# Patient Record
Sex: Female | Born: 1935 | Race: White | Hispanic: No | Marital: Married | Smoking: Former smoker
Health system: Southern US, Community
[De-identification: ages and names within clinical notes are randomized; demographics above are authoritative.]

## PROBLEM LIST (undated history)

## (undated) DIAGNOSIS — D649 Anemia, unspecified: Secondary | ICD-10-CM

## (undated) DIAGNOSIS — IMO0002 Reserved for concepts with insufficient information to code with codable children: Secondary | ICD-10-CM

## (undated) DIAGNOSIS — M1990 Unspecified osteoarthritis, unspecified site: Secondary | ICD-10-CM

## (undated) DIAGNOSIS — Z8719 Personal history of other diseases of the digestive system: Secondary | ICD-10-CM

## (undated) DIAGNOSIS — Z923 Personal history of irradiation: Secondary | ICD-10-CM

## (undated) DIAGNOSIS — K219 Gastro-esophageal reflux disease without esophagitis: Secondary | ICD-10-CM

## (undated) DIAGNOSIS — C50919 Malignant neoplasm of unspecified site of unspecified female breast: Secondary | ICD-10-CM

## (undated) HISTORY — PX: CARPAL TUNNEL RELEASE: SHX101

## (undated) HISTORY — DX: Gastro-esophageal reflux disease without esophagitis: K21.9

## (undated) HISTORY — DX: Unspecified osteoarthritis, unspecified site: M19.90

## (undated) HISTORY — PX: FINGER SURGERY: SHX640

## (undated) HISTORY — DX: Malignant neoplasm of unspecified site of unspecified female breast: C50.919

## (undated) HISTORY — DX: Anemia, unspecified: D64.9

---

## 2004-07-15 ENCOUNTER — Ambulatory Visit: Payer: Self-pay | Admitting: Gastroenterology

## 2006-08-18 ENCOUNTER — Ambulatory Visit: Payer: Self-pay | Admitting: Gastroenterology

## 2006-08-24 ENCOUNTER — Ambulatory Visit: Payer: Self-pay | Admitting: Gastroenterology

## 2006-12-29 ENCOUNTER — Ambulatory Visit: Payer: Self-pay | Admitting: Internal Medicine

## 2007-11-07 ENCOUNTER — Ambulatory Visit: Payer: Self-pay | Admitting: Gastroenterology

## 2008-07-15 ENCOUNTER — Ambulatory Visit: Payer: Self-pay | Admitting: Internal Medicine

## 2009-07-16 ENCOUNTER — Ambulatory Visit: Payer: Self-pay | Admitting: Internal Medicine

## 2009-09-10 ENCOUNTER — Ambulatory Visit: Payer: Self-pay | Admitting: Gastroenterology

## 2009-10-11 DIAGNOSIS — IMO0001 Reserved for inherently not codable concepts without codable children: Secondary | ICD-10-CM

## 2009-10-11 HISTORY — DX: Reserved for inherently not codable concepts without codable children: IMO0001

## 2010-02-18 ENCOUNTER — Ambulatory Visit: Payer: Self-pay | Admitting: Internal Medicine

## 2010-08-04 ENCOUNTER — Ambulatory Visit: Payer: Self-pay | Admitting: Surgery

## 2010-08-05 ENCOUNTER — Ambulatory Visit: Payer: Self-pay | Admitting: Surgery

## 2010-08-31 ENCOUNTER — Ambulatory Visit: Payer: Self-pay | Admitting: Internal Medicine

## 2010-09-04 ENCOUNTER — Ambulatory Visit: Payer: Self-pay | Admitting: Internal Medicine

## 2010-09-29 ENCOUNTER — Ambulatory Visit: Payer: Self-pay | Admitting: Surgery

## 2010-10-06 ENCOUNTER — Ambulatory Visit: Payer: Self-pay | Admitting: Surgery

## 2010-10-08 LAB — PATHOLOGY REPORT

## 2010-10-11 DIAGNOSIS — C50919 Malignant neoplasm of unspecified site of unspecified female breast: Secondary | ICD-10-CM

## 2010-10-11 HISTORY — PX: BREAST LUMPECTOMY: SHX2

## 2010-10-11 HISTORY — PX: BREAST BIOPSY: SHX20

## 2010-10-11 HISTORY — DX: Malignant neoplasm of unspecified site of unspecified female breast: C50.919

## 2010-10-21 ENCOUNTER — Ambulatory Visit: Payer: Self-pay | Admitting: Surgery

## 2010-10-22 ENCOUNTER — Ambulatory Visit: Payer: Self-pay | Admitting: Oncology

## 2010-10-26 LAB — PATHOLOGY REPORT

## 2010-11-04 ENCOUNTER — Ambulatory Visit: Payer: Self-pay | Admitting: Oncology

## 2010-11-11 ENCOUNTER — Ambulatory Visit: Payer: Self-pay | Admitting: Oncology

## 2010-12-10 ENCOUNTER — Ambulatory Visit: Payer: Self-pay | Admitting: Oncology

## 2011-01-10 ENCOUNTER — Ambulatory Visit: Payer: Self-pay | Admitting: Oncology

## 2011-02-09 ENCOUNTER — Ambulatory Visit: Payer: Self-pay | Admitting: Oncology

## 2011-02-25 LAB — CANCER ANTIGEN 27.29: CA 27.29: 38.7 U/mL — ABNORMAL HIGH (ref 0.0–38.6)

## 2011-03-12 ENCOUNTER — Ambulatory Visit: Payer: Self-pay | Admitting: Oncology

## 2011-04-17 ENCOUNTER — Other Ambulatory Visit: Payer: Self-pay | Admitting: Internal Medicine

## 2011-04-26 ENCOUNTER — Ambulatory Visit: Payer: Self-pay | Admitting: Oncology

## 2011-05-12 ENCOUNTER — Ambulatory Visit: Payer: Self-pay | Admitting: Oncology

## 2011-06-12 ENCOUNTER — Ambulatory Visit: Payer: Self-pay | Admitting: Oncology

## 2011-07-27 ENCOUNTER — Ambulatory Visit: Payer: Self-pay | Admitting: Oncology

## 2011-08-12 ENCOUNTER — Ambulatory Visit: Payer: Self-pay | Admitting: Oncology

## 2011-09-11 ENCOUNTER — Ambulatory Visit: Payer: Self-pay | Admitting: Oncology

## 2011-09-11 LAB — CANCER ANTIGEN 27.29: CA 27.29: 27.4 U/mL (ref 0.0–38.6)

## 2011-10-13 ENCOUNTER — Ambulatory Visit: Payer: Self-pay | Admitting: Oncology

## 2011-12-23 ENCOUNTER — Ambulatory Visit: Payer: Self-pay | Admitting: Gastroenterology

## 2012-03-08 ENCOUNTER — Ambulatory Visit: Payer: Self-pay | Admitting: Oncology

## 2012-03-10 LAB — CBC CANCER CENTER
Eosinophil #: 0.1 x10 3/mm (ref 0.0–0.7)
Eosinophil %: 1.7 %
HGB: 12.6 g/dL (ref 12.0–16.0)
Lymphocyte #: 1.4 x10 3/mm (ref 1.0–3.6)
Lymphocyte %: 29.4 %
MCV: 100 fL (ref 80–100)
Monocyte %: 9.5 %
Neutrophil %: 58.8 %
Platelet: 146 x10 3/mm — ABNORMAL LOW (ref 150–440)
RBC: 3.72 10*6/uL — ABNORMAL LOW (ref 3.80–5.20)
RDW: 13.2 % (ref 11.5–14.5)

## 2012-03-10 LAB — COMPREHENSIVE METABOLIC PANEL
Anion Gap: 8 (ref 7–16)
Bilirubin,Total: 0.7 mg/dL (ref 0.2–1.0)
Chloride: 107 mmol/L (ref 98–107)
Co2: 27 mmol/L (ref 21–32)
Creatinine: 0.89 mg/dL (ref 0.60–1.30)
EGFR (African American): >60
EGFR (Non-African Amer.): >60
Glucose: 111 mg/dL — ABNORMAL HIGH (ref 65–99)
Potassium: 3.9 mmol/L (ref 3.5–5.1)
SGOT(AST): 21 U/L (ref 15–37)
Total Protein: 6.6 g/dL (ref 6.4–8.2)

## 2012-03-11 ENCOUNTER — Ambulatory Visit: Payer: Self-pay | Admitting: Oncology

## 2012-03-11 LAB — CANCER ANTIGEN 27.29: CA 27.29: 32.8 U/mL (ref 0.0–38.6)

## 2012-10-16 ENCOUNTER — Ambulatory Visit: Payer: Self-pay | Admitting: Oncology

## 2012-10-18 ENCOUNTER — Ambulatory Visit: Payer: Self-pay | Admitting: Oncology

## 2012-10-20 ENCOUNTER — Ambulatory Visit: Payer: Self-pay | Admitting: Oncology

## 2012-10-20 LAB — COMPREHENSIVE METABOLIC PANEL
Albumin: 3.6 g/dL (ref 3.4–5.0)
Alkaline Phosphatase: 54 U/L (ref 50–136)
Bilirubin,Total: 0.5 mg/dL (ref 0.2–1.0)
Co2: 29 mmol/L (ref 21–32)
Creatinine: 0.93 mg/dL (ref 0.60–1.30)
Glucose: 157 mg/dL — ABNORMAL HIGH (ref 65–99)
Osmolality: 292 (ref 275–301)
SGPT (ALT): 22 U/L (ref 12–78)
Sodium: 144 mmol/L (ref 136–145)

## 2012-10-20 LAB — CBC CANCER CENTER
Basophil #: 0 x10 3/mm (ref 0.0–0.1)
Basophil %: 0.9 %
Eosinophil #: 0.1 x10 3/mm (ref 0.0–0.7)
Eosinophil %: 1 %
HGB: 12.9 g/dL (ref 12.0–16.0)
Lymphocyte #: 1.3 x10 3/mm (ref 1.0–3.6)
MCH: 32.9 pg (ref 26.0–34.0)
MCV: 98 fL (ref 80–100)
Monocyte #: 0.4 x10 3/mm (ref 0.2–0.9)
Monocyte %: 8.3 %
Neutrophil #: 3.4 x10 3/mm (ref 1.4–6.5)
Neutrophil %: 64.4 %
Platelet: 155 x10 3/mm (ref 150–440)
RDW: 12.8 % (ref 11.5–14.5)
WBC: 5.2 x10 3/mm (ref 3.6–11.0)

## 2012-11-09 ENCOUNTER — Ambulatory Visit: Payer: Self-pay | Admitting: Surgery

## 2012-11-09 DIAGNOSIS — I10 Essential (primary) hypertension: Secondary | ICD-10-CM

## 2012-11-11 ENCOUNTER — Ambulatory Visit: Payer: Self-pay | Admitting: Oncology

## 2012-11-14 IMAGING — US ULTRASOUND RIGHT BREAST
1 series · 17 of 25 positions shown · non-contrast
Comparison: 08/31/2010, 07/16/2009 and 07/15/2008.

REASON FOR EXAM: RT ROUNDED ASYMMETRY
COMMENTS:

PROCEDURE:     US  - US BREAST RIGHT  - September 04, 2010 [DATE]
RESULT:

[Series 1: ultrasound right breast · 17 of 27 slices shown]
[im 1/27]
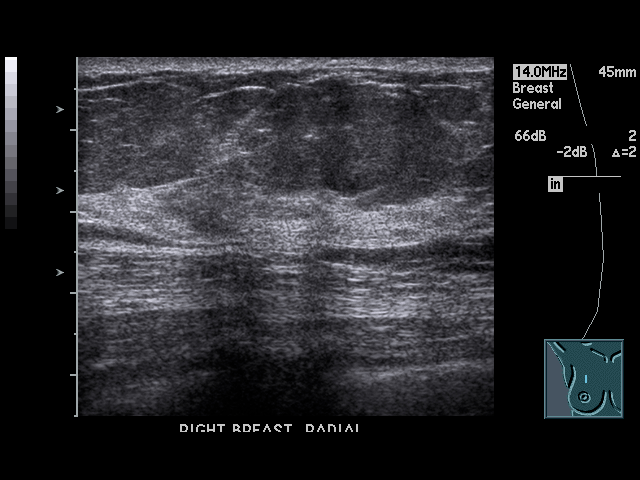
[im 3/27]
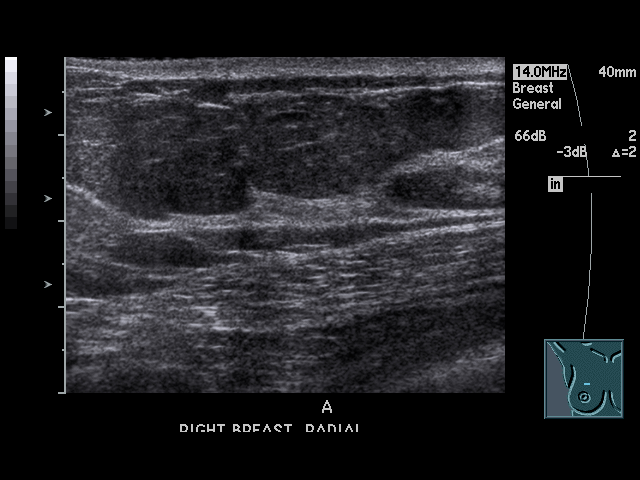
[im 4/27]
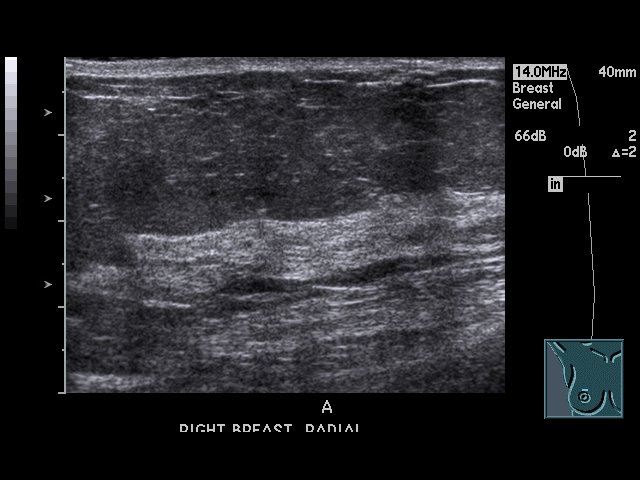
[im 6/27]
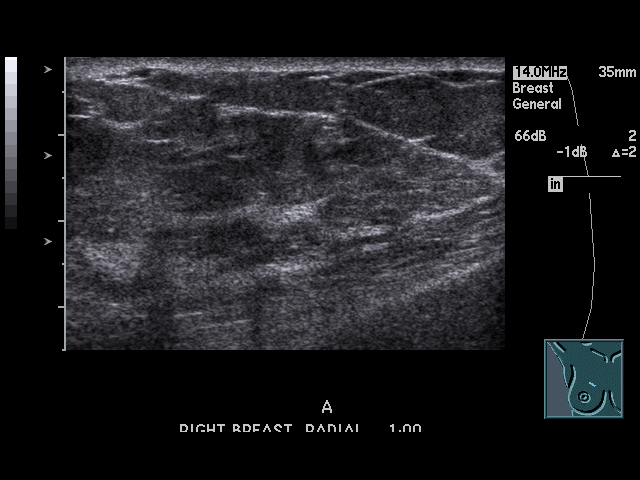
[im 7/27]
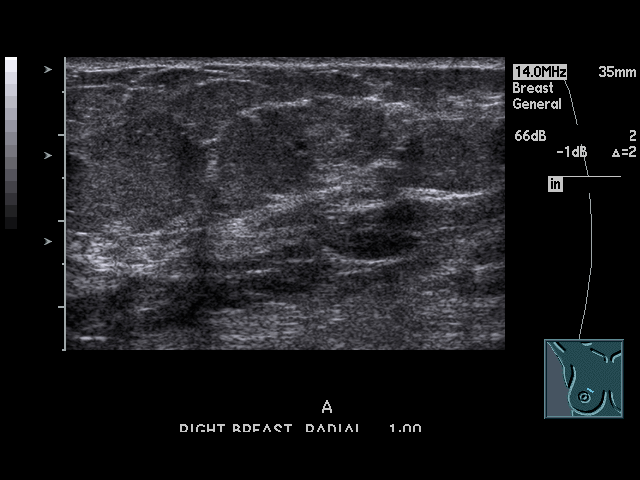
[im 9/27]
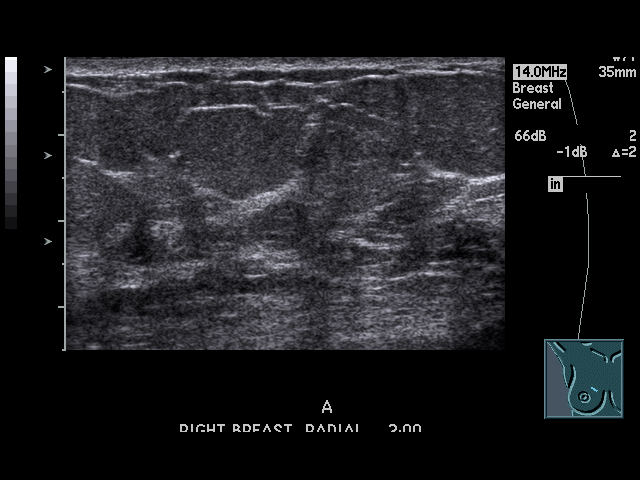
[im 10/27]
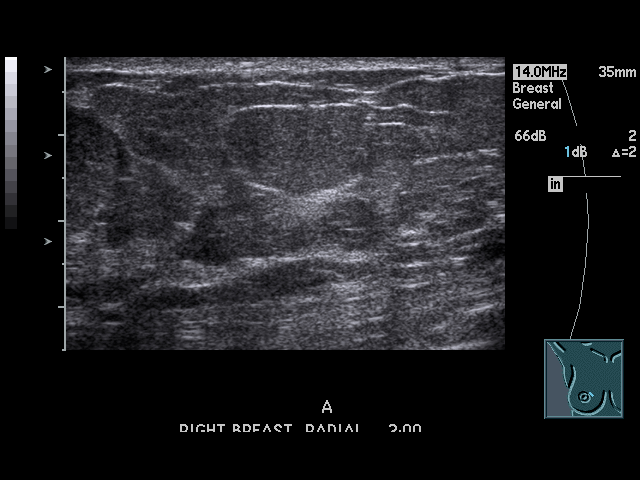
[im 12/27]
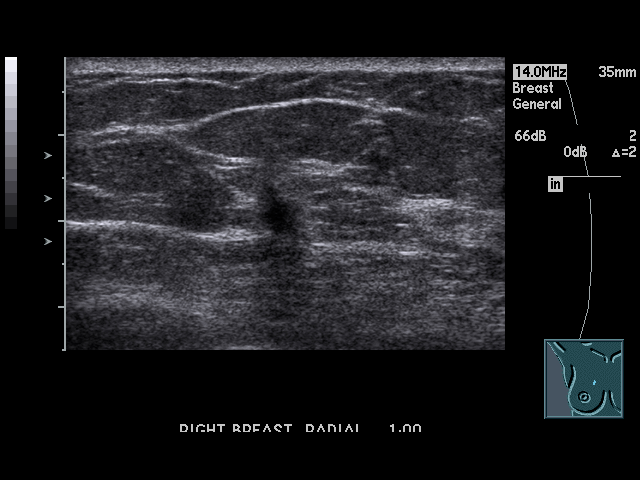
[im 14/27]
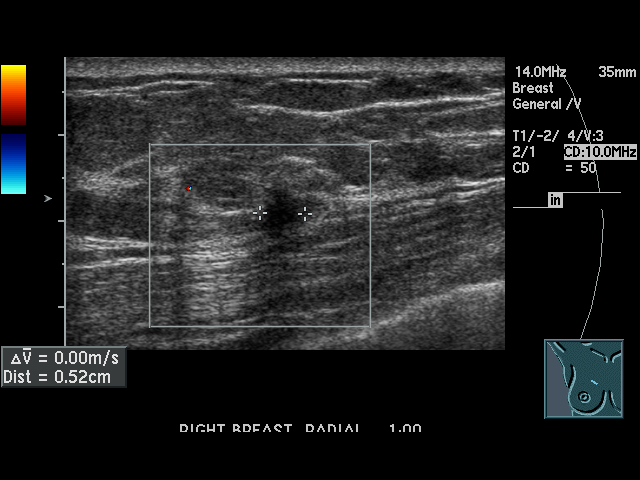
[im 15/27]
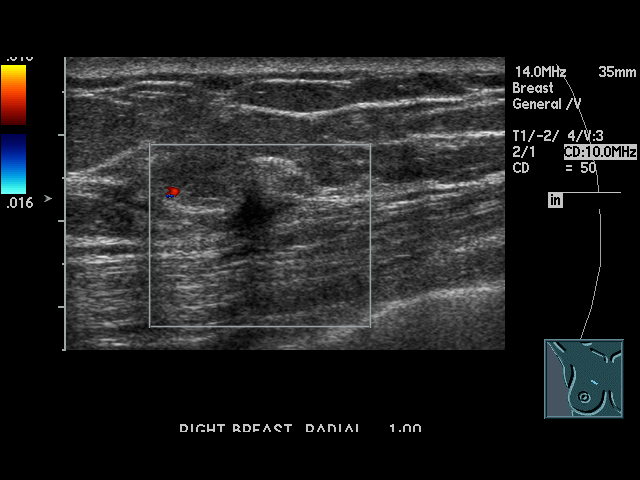
[im 17/27]
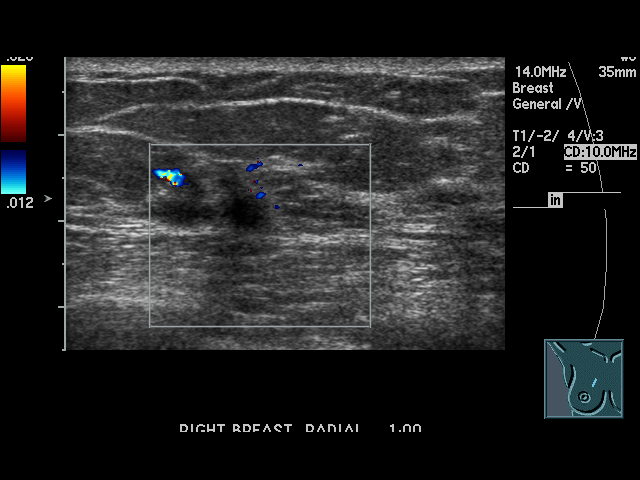
[im 18/27]
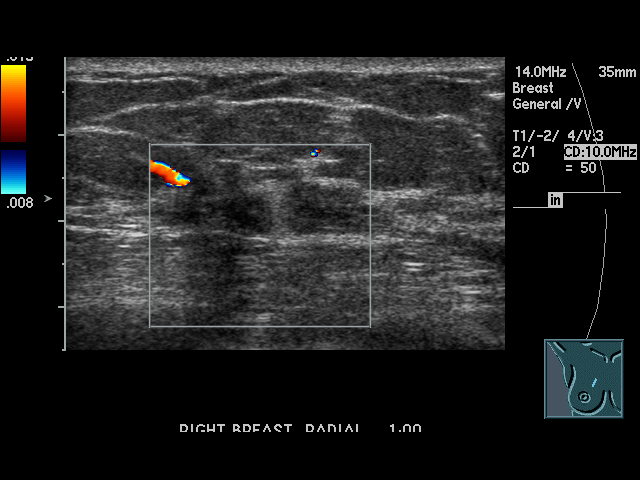
[im 20/27]
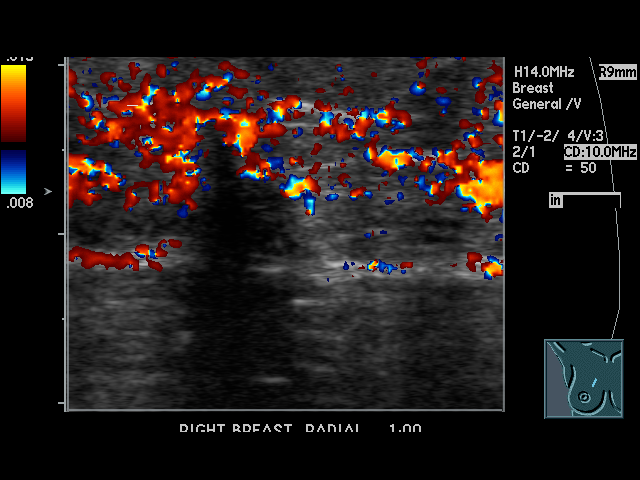
[im 21/27]
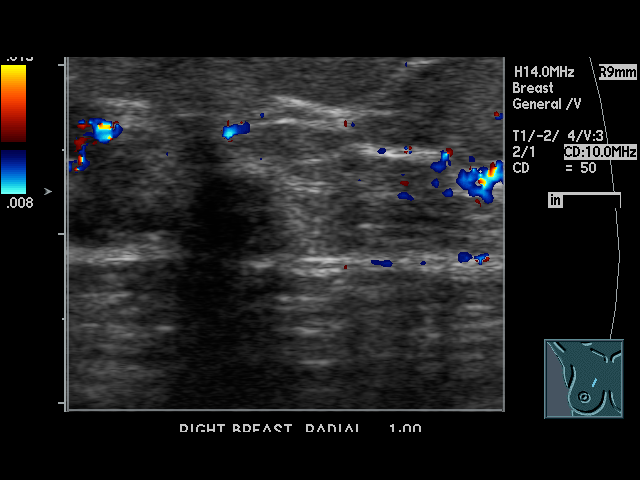
[im 23/27]
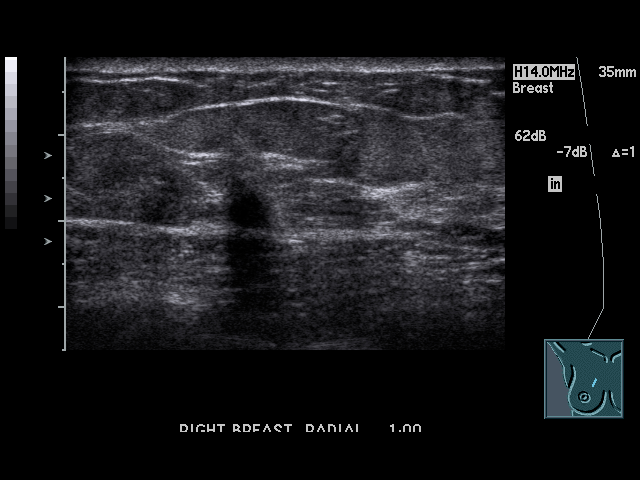
[im 24/27]
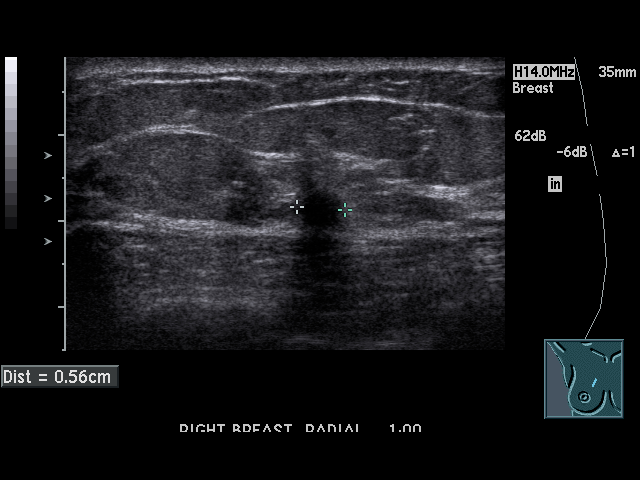
[im 27/27]
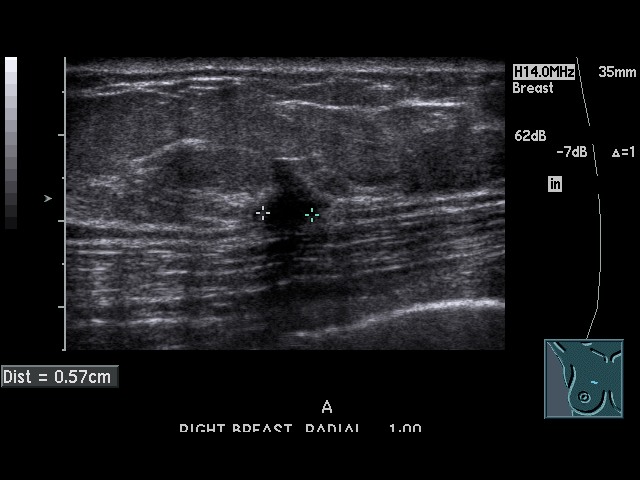

[17 of 25 positions shown; findings below may reference images not displayed]

FINDINGS: True lateral and spot compression magnification views were
performed to evaluate the findings in the right breast on the screening
mammograms. These views demonstrated a small mass in the superior medial
aspect of the right breast at approximately 1 o'clock. The mass demonstrates
spiculated margins. There are associated pleomorphic microcalcifications.

Real-time ultrasound was performed of the right breast. At approximately 1
o'clock there is a small, hypoechoic mass which presents predominantly with
heterogeneous shadowing. It measures approximately 6 mm in diameter. This
mass is just above the pectoralis muscle.
IMPRESSION: BI-RADS: Category 4C: Moderately high suspicion for malignancy.

RECOMMENDATION:  Surgical consultation and tissue diagnosis is suggested for
the mass in the superior medial right beast.

## 2012-12-31 IMAGING — NM NM SENTINAL NODE INJECTION (BREAST) - NO REPORT
2 series · 16 of 16 positions shown · non-contrast
Comparison: none

REASON FOR EXAM: rt breast partial mastectomy SN bx poss axillary
dissection  surgery at 11am...
COMMENTS:

PROCEDURE:     NM  - NM SENTINEL NODE  BREAST  - October 21, 2010  [DATE]
RESULT:     Comparison:  None.
Radiopharmaceutical:  1.07 mCi Nc-YYm unfiltered sulfur colloid in 2 mL
volume, injected subcutaneously into the periareolar tissues of the right
breast.
TECHNIQUE: Using sterile technique, local anesthesia was provided with
approximately 2 mL of 1% lidocaine. Using a 27 gauge, 1/2 inch needle, the
radiopharmaceutical was injected into the subcutaneous periareolar tissues
of the left breast. Planar images were obtained in the anterior and left
lateral projections, with the use of a Uo-H1 transmission source. The
patient's arm was abducted at 90 degrees from the body for the anterior
images, and raised over the head for the lateral images.

[Series 1000: sent node breast static · 2.40mm/px · 5 acquisitions, 10 frames shown]
[im 1/5]
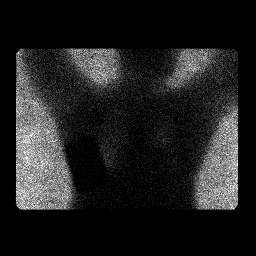
[im 1/5]
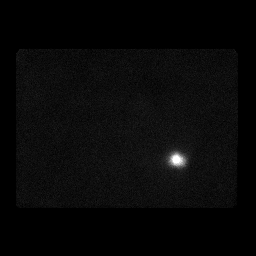
[im 2/5]
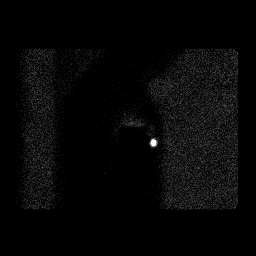
[im 2/5]
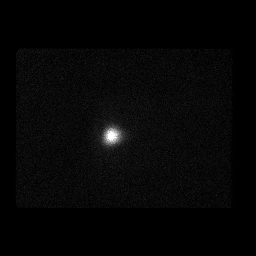
[im 3/5]
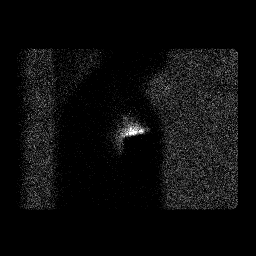
[im 3/5]
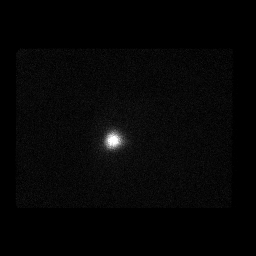
[im 4/5]
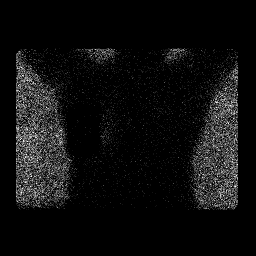
[im 4/5]
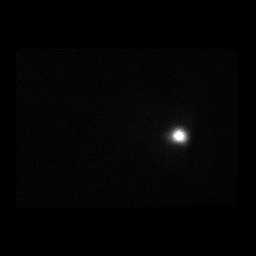
[im 5/5]
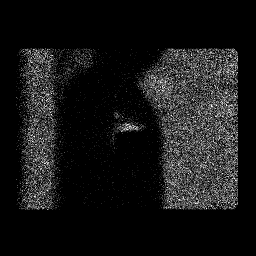
[im 5/5]
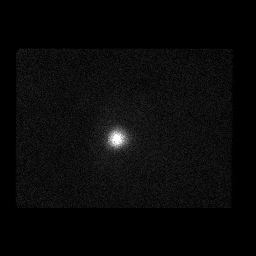

[Series 1000: sent node breast-dynamic · 4.80mm/px · 6 of 59 frames shown]
[frame 5/59  full-range]
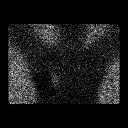
[frame 15/59  full-range]
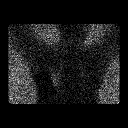
[frame 25/59  full-range]
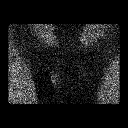
[frame 35/59  full-range]
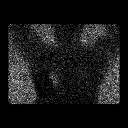
[frame 45/59  full-range]
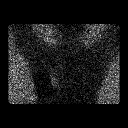
[frame 55/59  full-range]
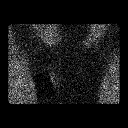

[16 of 16 positions shown; findings below may reference images not displayed]

FINDINGS: Images obtained at 90 minutes post injection demonstrate
localization of radiotracer in the region of the right lateral breast.
IMPRESSION: Successful injection of radiotracer into the right breast
periareolar soft tissues for intraoperative localization.

## 2013-03-08 ENCOUNTER — Ambulatory Visit: Payer: Self-pay | Admitting: Radiation Oncology

## 2013-03-11 ENCOUNTER — Ambulatory Visit: Payer: Self-pay | Admitting: Radiation Oncology

## 2013-05-14 ENCOUNTER — Ambulatory Visit: Payer: Self-pay | Admitting: Surgery

## 2013-10-18 ENCOUNTER — Ambulatory Visit: Payer: Self-pay | Admitting: Gastroenterology

## 2013-12-10 ENCOUNTER — Ambulatory Visit: Payer: Self-pay | Admitting: Gastroenterology

## 2014-01-15 ENCOUNTER — Ambulatory Visit: Payer: Self-pay | Admitting: Oncology

## 2014-01-21 ENCOUNTER — Ambulatory Visit: Payer: Self-pay | Admitting: Oncology

## 2014-01-22 LAB — COMPREHENSIVE METABOLIC PANEL
ALBUMIN: 3.7 g/dL (ref 3.4–5.0)
ANION GAP: 8 (ref 7–16)
Alkaline Phosphatase: 52 U/L
BILIRUBIN TOTAL: 0.5 mg/dL (ref 0.2–1.0)
BUN: 16 mg/dL (ref 7–18)
CHLORIDE: 105 mmol/L (ref 98–107)
CREATININE: 0.88 mg/dL (ref 0.60–1.30)
Calcium, Total: 9.6 mg/dL (ref 8.5–10.1)
Co2: 31 mmol/L (ref 21–32)
EGFR (African American): >60
EGFR (Non-African Amer.): >60
GLUCOSE: 87 mg/dL (ref 65–99)
Osmolality: 287 (ref 275–301)
Potassium: 4.3 mmol/L (ref 3.5–5.1)
SGOT(AST): 13 U/L — ABNORMAL LOW (ref 15–37)
SGPT (ALT): 16 U/L (ref 12–78)
Sodium: 144 mmol/L (ref 136–145)
TOTAL PROTEIN: 7 g/dL (ref 6.4–8.2)

## 2014-01-22 LAB — CBC CANCER CENTER
Basophil #: 0 x10 3/mm (ref 0.0–0.1)
Basophil %: 0.8 %
EOS PCT: 1.6 %
Eosinophil #: 0.1 x10 3/mm (ref 0.0–0.7)
HCT: 39.4 % (ref 35.0–47.0)
HGB: 12.9 g/dL (ref 12.0–16.0)
LYMPHS ABS: 1.7 x10 3/mm (ref 1.0–3.6)
LYMPHS PCT: 30.6 %
MCH: 32.1 pg (ref 26.0–34.0)
MCHC: 32.8 g/dL (ref 32.0–36.0)
MCV: 98 fL (ref 80–100)
MONO ABS: 0.7 x10 3/mm (ref 0.2–0.9)
Monocyte %: 11.8 %
NEUTROS PCT: 55.2 %
Neutrophil #: 3.2 x10 3/mm (ref 1.4–6.5)
Platelet: 173 x10 3/mm (ref 150–440)
RBC: 4.03 10*6/uL (ref 3.80–5.20)
RDW: 13.3 % (ref 11.5–14.5)
WBC: 5.7 x10 3/mm (ref 3.6–11.0)

## 2014-02-08 ENCOUNTER — Ambulatory Visit: Payer: Self-pay | Admitting: Oncology

## 2014-02-22 ENCOUNTER — Ambulatory Visit: Payer: Self-pay | Admitting: Gastroenterology

## 2014-05-17 DIAGNOSIS — K219 Gastro-esophageal reflux disease without esophagitis: Secondary | ICD-10-CM | POA: Insufficient documentation

## 2014-05-17 DIAGNOSIS — E785 Hyperlipidemia, unspecified: Secondary | ICD-10-CM | POA: Insufficient documentation

## 2014-05-17 DIAGNOSIS — R739 Hyperglycemia, unspecified: Secondary | ICD-10-CM | POA: Insufficient documentation

## 2014-05-17 DIAGNOSIS — I10 Essential (primary) hypertension: Secondary | ICD-10-CM | POA: Insufficient documentation

## 2014-06-04 DIAGNOSIS — D510 Vitamin B12 deficiency anemia due to intrinsic factor deficiency: Secondary | ICD-10-CM | POA: Insufficient documentation

## 2014-07-04 ENCOUNTER — Ambulatory Visit: Payer: Self-pay | Admitting: Oncology

## 2014-07-04 LAB — COMPREHENSIVE METABOLIC PANEL
ALK PHOS: 54 U/L
ANION GAP: 7 (ref 7–16)
AST: 17 U/L (ref 15–37)
Albumin: 3.8 g/dL (ref 3.4–5.0)
BILIRUBIN TOTAL: 0.6 mg/dL (ref 0.2–1.0)
BUN: 17 mg/dL (ref 7–18)
CREATININE: 0.98 mg/dL (ref 0.60–1.30)
Calcium, Total: 9.2 mg/dL (ref 8.5–10.1)
Chloride: 100 mmol/L (ref 98–107)
Co2: 31 mmol/L (ref 21–32)
EGFR (African American): >60
EGFR (Non-African Amer.): 59 — ABNORMAL LOW
Glucose: 210 mg/dL — ABNORMAL HIGH (ref 65–99)
Osmolality: 283 (ref 275–301)
Potassium: 3.5 mmol/L (ref 3.5–5.1)
SGPT (ALT): 22 U/L
Sodium: 138 mmol/L (ref 136–145)
Total Protein: 6.6 g/dL (ref 6.4–8.2)

## 2014-07-04 LAB — CBC CANCER CENTER
Basophil #: 0.1 x10 3/mm (ref 0.0–0.1)
Basophil %: 0.9 %
EOS ABS: 0.1 x10 3/mm (ref 0.0–0.7)
EOS PCT: 1.8 %
HCT: 40.2 % (ref 35.0–47.0)
HGB: 13.4 g/dL (ref 12.0–16.0)
Lymphocyte #: 1.7 x10 3/mm (ref 1.0–3.6)
Lymphocyte %: 27.9 %
MCH: 33 pg (ref 26.0–34.0)
MCHC: 33.3 g/dL (ref 32.0–36.0)
MCV: 99 fL (ref 80–100)
MONO ABS: 0.4 x10 3/mm (ref 0.2–0.9)
MONOS PCT: 7.1 %
NEUTROS ABS: 3.8 x10 3/mm (ref 1.4–6.5)
NEUTROS PCT: 62.3 %
Platelet: 168 x10 3/mm (ref 150–440)
RBC: 4.05 10*6/uL (ref 3.80–5.20)
RDW: 13.5 % (ref 11.5–14.5)
WBC: 6.1 x10 3/mm (ref 3.6–11.0)

## 2014-07-11 ENCOUNTER — Ambulatory Visit: Payer: Self-pay | Admitting: Oncology

## 2015-01-20 ENCOUNTER — Ambulatory Visit: Admit: 2015-01-20 | Disposition: A | Discharge status: Home / Self Care | Payer: Self-pay | Admitting: Oncology

## 2015-01-22 ENCOUNTER — Ambulatory Visit
Admit: 2015-01-22 | Disposition: A | Discharge status: Home / Self Care | Payer: Self-pay | Attending: Oncology | Admitting: Oncology

## 2015-01-22 LAB — CBC CANCER CENTER
BASOS ABS: 0 x10 3/mm (ref 0.0–0.1)
BASOS PCT: 0.8 %
EOS ABS: 0.1 x10 3/mm (ref 0.0–0.7)
Eosinophil %: 1.3 %
HCT: 39.3 % (ref 35.0–47.0)
HGB: 13.4 g/dL (ref 12.0–16.0)
LYMPHS ABS: 1.8 x10 3/mm (ref 1.0–3.6)
Lymphocyte %: 29.9 %
MCH: 33 pg (ref 26.0–34.0)
MCHC: 34.2 g/dL (ref 32.0–36.0)
MCV: 97 fL (ref 80–100)
MONO ABS: 0.5 x10 3/mm (ref 0.2–0.9)
Monocyte %: 9.3 %
NEUTROS ABS: 3.5 x10 3/mm (ref 1.4–6.5)
Neutrophil %: 58.7 %
Platelet: 166 x10 3/mm (ref 150–440)
RBC: 4.07 10*6/uL (ref 3.80–5.20)
RDW: 13.3 % (ref 11.5–14.5)
WBC: 5.9 x10 3/mm (ref 3.6–11.0)

## 2015-01-22 LAB — COMPREHENSIVE METABOLIC PANEL
Albumin: 4.2 g/dL
Alkaline Phosphatase: 42 U/L
Anion Gap: 6 — ABNORMAL LOW (ref 7–16)
BUN: 15 mg/dL
Bilirubin,Total: 0.7 mg/dL
CREATININE: 0.81 mg/dL
Calcium, Total: 9.4 mg/dL
Chloride: 104 mmol/L
Co2: 28 mmol/L
EGFR (African American): >60
EGFR (Non-African Amer.): >60
Glucose: 115 mg/dL — ABNORMAL HIGH
POTASSIUM: 4.1 mmol/L
SGOT(AST): 20 U/L
SGPT (ALT): 15 U/L
SODIUM: 138 mmol/L
Total Protein: 7 g/dL

## 2015-03-06 ENCOUNTER — Encounter (INDEPENDENT_AMBULATORY_CARE_PROVIDER_SITE_OTHER): Payer: Self-pay

## 2015-03-06 ENCOUNTER — Other Ambulatory Visit: Payer: Self-pay | Admitting: *Deleted

## 2015-03-06 ENCOUNTER — Ambulatory Visit
Admission: RE | Admit: 2015-03-06 | Discharge: 2015-03-06 | Disposition: A | Discharge status: Home / Self Care | Payer: Medicare Other | Source: Ambulatory Visit | Attending: Radiation Oncology | Admitting: Radiation Oncology

## 2015-03-06 ENCOUNTER — Encounter: Payer: Self-pay | Admitting: Radiation Oncology

## 2015-03-06 VITALS — BP 152/85 | HR 66 | Temp 97.8°F | Resp 18 | Ht 62.0 in | Wt 160.6 lb

## 2015-03-06 DIAGNOSIS — C50411 Malignant neoplasm of upper-outer quadrant of right female breast: Secondary | ICD-10-CM

## 2015-03-06 NOTE — Progress Notes (Signed)
Radiation Oncology Follow up Note  Name: Cheryl Diaz   Date:   03/06/2015 MRN:  [id number] DOB: 25-Oct-1935    This 79 y.o. female presents to the clinic today for follow-up of breast cancer.  REFERRING PROVIDER: No ref. provider found  HPI: Patient is a 79 year old female now out 4 years having completed radiation therapy to her right breast for a stage IC ER/PR positive HER-2/neu negative invasive mammary carcinoma with Oncotype DX score of 15. She been on multiple aromatase inhibitors including rheumatoid asked as well as tamoxifen which have both been discontinued secondary to patient tolerance. Mostly exacerbation of her arthritis. She specifically denies breast tenderness cough or bone pain. His a small less than 1 cm soft node present in her right axilla which is dry my attention to she states has been unchanged over the past year..  COMPLICATIONS OF TREATMENT: none  FOLLOW UP COMPLIANCE: keeps appointments   PHYSICAL EXAM:  BP 152/85 mmHg  Pulse 66  Temp(Src) 97.8 F (36.6 C)  Resp 18  Ht $R'5\' 2"'hD$  (1.575 m)  Wt 160 lb 9.7 oz (72.85 kg)  BMI 29.37 kg/m2 Well-developed elderly female in NAD. Right breast has inverted nipple. There is a small less than 1 cm soft well circumscribed apparent lymph node in the right axilla somewhat tender to the touch. Left breast is free of dominant mass or nodularity in 2 positions examined. No supraclavicular adenopathy is identified. Well-developed well-nourished patient in NAD. HEENT reveals PERLA, EOMI, discs not visualized.  Oral cavity is clear. No oral mucosal lesions are identified. Neck is clear without evidence of cervical or supraclavicular adenopathy. Lungs are clear to A&P. Cardiac examination is essentially unremarkable with regular rate and rhythm without murmur rub or thrill. Abdomen is benign with no organomegaly or masses noted. Motor sensory and DTR levels are equal and symmetric in the upper and lower extremities. Cranial nerves  II through XII are grossly intact. Proprioception is intact. No peripheral adenopathy or edema is identified. No motor or sensory levels are noted. Crude visual fields are within normal range.   RADIOLOGY RESULTS: Patient is scheduled for mammograms next week.  PLAN: At the present time she is now 4 years out and doing well. I've asked her to draw the attention to this small node to medical oncology and she is to contact my office should she notice any enlargement or worsening symptoms. At this time patient wishes to discontinue follow-up care I am turning follow-up care over to medical oncology. We'll be happy to reevaluate patient at any time in the future should radiation oncology opinion be indicated.  I would like to take this opportunity for allowing me to participate in the care of your patient.Armstead Peaks., MD

## 2015-03-25 ENCOUNTER — Other Ambulatory Visit: Payer: Self-pay | Admitting: Oncology

## 2015-07-17 ENCOUNTER — Other Ambulatory Visit: Payer: Self-pay | Admitting: *Deleted

## 2015-07-17 DIAGNOSIS — C50919 Malignant neoplasm of unspecified site of unspecified female breast: Secondary | ICD-10-CM

## 2015-07-24 ENCOUNTER — Inpatient Hospital Stay (HOSPITAL_BASED_OUTPATIENT_CLINIC_OR_DEPARTMENT_OTHER): Payer: Medicare Other | Admitting: Oncology

## 2015-07-24 ENCOUNTER — Inpatient Hospital Stay: Payer: Medicare Other | Attending: Oncology

## 2015-07-24 VITALS — BP 136/73 | HR 73 | Temp 95.8°F | Wt 151.5 lb

## 2015-07-24 DIAGNOSIS — Z170 Estrogen receptor positive status [ER+]: Secondary | ICD-10-CM | POA: Diagnosis not present

## 2015-07-24 DIAGNOSIS — Z87891 Personal history of nicotine dependence: Secondary | ICD-10-CM | POA: Diagnosis not present

## 2015-07-24 DIAGNOSIS — Z79810 Long term (current) use of selective estrogen receptor modulators (SERMs): Secondary | ICD-10-CM | POA: Diagnosis not present

## 2015-07-24 DIAGNOSIS — C50911 Malignant neoplasm of unspecified site of right female breast: Secondary | ICD-10-CM

## 2015-07-24 DIAGNOSIS — Z923 Personal history of irradiation: Secondary | ICD-10-CM | POA: Insufficient documentation

## 2015-07-24 DIAGNOSIS — Z79899 Other long term (current) drug therapy: Secondary | ICD-10-CM | POA: Insufficient documentation

## 2015-07-24 DIAGNOSIS — K219 Gastro-esophageal reflux disease without esophagitis: Secondary | ICD-10-CM | POA: Diagnosis not present

## 2015-07-24 DIAGNOSIS — C50919 Malignant neoplasm of unspecified site of unspecified female breast: Secondary | ICD-10-CM

## 2015-07-24 LAB — CBC WITH DIFFERENTIAL/PLATELET
BASOS ABS: 0 10*3/uL (ref 0–0.1)
Basophils Relative: 1 %
EOS ABS: 0.1 10*3/uL (ref 0–0.7)
Eosinophils Relative: 2 %
HCT: 38.8 % (ref 35.0–47.0)
Hemoglobin: 13.1 g/dL (ref 12.0–16.0)
Lymphocytes Relative: 23 %
Lymphs Abs: 1.3 10*3/uL (ref 1.0–3.6)
MCH: 32.9 pg (ref 26.0–34.0)
MCHC: 33.8 g/dL (ref 32.0–36.0)
MCV: 97.3 fL (ref 80.0–100.0)
MONO ABS: 0.4 10*3/uL (ref 0.2–0.9)
Monocytes Relative: 8 %
Neutro Abs: 3.8 10*3/uL (ref 1.4–6.5)
Neutrophils Relative %: 68 %
PLATELETS: 154 10*3/uL (ref 150–440)
RBC: 3.99 MIL/uL (ref 3.80–5.20)
RDW: 13.1 % (ref 11.5–14.5)
WBC: 5.6 10*3/uL (ref 3.6–11.0)

## 2015-07-24 LAB — COMPREHENSIVE METABOLIC PANEL
ALBUMIN: 3.8 g/dL (ref 3.5–5.0)
ALT: 14 U/L (ref 14–54)
AST: 23 U/L (ref 15–41)
Alkaline Phosphatase: 44 U/L (ref 38–126)
Anion gap: 5 (ref 5–15)
BUN: 20 mg/dL (ref 6–20)
CHLORIDE: 106 mmol/L (ref 101–111)
CO2: 29 mmol/L (ref 22–32)
Calcium: 8.6 mg/dL — ABNORMAL LOW (ref 8.9–10.3)
Creatinine, Ser: 0.86 mg/dL (ref 0.44–1.00)
GFR calc Af Amer: >60 mL/min (ref >60)
Glucose, Bld: 168 mg/dL — ABNORMAL HIGH (ref 65–99)
POTASSIUM: 3.9 mmol/L (ref 3.5–5.1)
Sodium: 140 mmol/L (ref 135–145)
TOTAL PROTEIN: 6.4 g/dL — AB (ref 6.5–8.1)
Total Bilirubin: 1 mg/dL (ref 0.3–1.2)

## 2015-07-25 LAB — CANCER ANTIGEN 27.29: CA 27.29: 30.8 U/mL (ref 0.0–38.6)

## 2015-07-26 ENCOUNTER — Encounter: Payer: Self-pay | Admitting: Oncology

## 2015-07-26 NOTE — Progress Notes (Signed)
[place] @ [facility] [phone]  Fax:([phone]) [place] OB: 1936/07/21  MR#: [id number]  OBS#:[id number]  Patient Care Team: Kirk Ruths, MD as PCP - General (Internal Medicine)  CHIEF COMPLAINT:  Chief Complaint/Diagnosis:   Carcinoma of breast (right), stage IC, ER positive, PR positive, HER-2 negative by FISH (in January.:  2012)  Oncotype DX score is 15  patient had to stage IC disease.  Also, was seen by radiation oncologist.  Patient with family for further evaluation and treatment consideration 2.  Arimidex 1 mg by mouth daily  starting from May of 2012 3.  Finished radiation therapy in May of 2012 4.on tamoxifen because of poor tolerance to Arimidex from August of 2012  INTERVAL HISTORY:  79 year old lady came today further follow-up regarding carcinoma of breast.  Patient is taking tamoxifen and tolerating very well.  The patient will finish her 5 years of tamoxifen therapy in January of 2017.  Here for further follow-up and treatment consideration  REVIEW OF SYSTEMS:   GENERAL:  Feels good.  Active.  No fevers, sweats or weight loss. PERFORMANCE STATUS (ECOG):  01 HEENT:  No visual changes, runny nose, sore throat, mouth sores or tenderness. Lungs: No shortness of breath or cough.  No hemoptysis. Cardiac:  No chest pain, palpitations, orthopnea, or PND. GI:  No nausea, vomiting, diarrhea, constipation, melena or hematochezia. GU:  No urgency, frequency, dysuria, or hematuria. Musculoskeletal:  No back pain.  No joint pain.  No muscle tenderness. Extremities:  No pain or swelling. Skin:  No rashes or skin changes. Neuro:  No headache, numbness or weakness, balance or coordination issues. Endocrine:  No diabetes, thyroid issues, hot flashes or night sweats. Psych:  No mood changes, depression or anxiety. Pain:  No focal pain. Review of systems:  All other systems reviewed and found to be negative. As per HPI. Otherwise, a complete  review of systems is negatve.  PAST MEDICAL HISTORY: Past Medical History  Diagnosis Date  . Breast cancer ([place]) 10/2010    ER/PR+, Her2neu-  . Arthritis   . GERD (gastroesophageal reflux disease)   . Anemia     PAST SURGICAL HISTORY: Past Surgical History  Procedure Laterality Date  . Carpal tunnel release      Significant History/PMH:   Breast Cancer:    degenerative disc disease:    GERD:    Arthritis:    Anemia:    Carpal Tunnel Release:   Preventive Screening:  Has patient had any of the following test? Colonscopy  Mammography   Last Colonoscopy: 2009   Last Mammography: April 2015   Smoking History: Smoking History quit 48 years ago.  PFSH: Family History: No family history of colorectal cancer, breast cancer, or ovarian cancer.   .  Family history of hypertension  Social History: negative alcohol, negative tobacco  Comments: Had smoked in the past for 10 years.  Quit   smoking many years ago  Additional Past Medical and Surgical History: Gastroesophageal reflux   Esophageal dietitian  by endoscopy   Had colonoscopy done in 2009.  Had polyps make atypical hyperplasia   Had basal cell carcinoma of the left   side of , nose.   ADVANCED DIRECTIVES:  No flowsheet data found.  HEALTH MAINTENANCE: Social History  Substance Use Topics  . Smoking status: Former Research scientist (life sciences)  . Smokeless tobacco: None  . Alcohol Use: None      Allergies  Allergen Reactions  . Penicillin G Other (  See Comments)    lumps and swelling, hives  . Cephalosporins Rash    Current Outpatient Prescriptions  Medication Sig Dispense Refill  . Calcium Carb-Cholecalciferol (CALCIUM 600 + D PO) Take 1 tablet by mouth daily.    . cyanocobalamin (,VITAMIN B-12,) 1000 MCG/ML injection Inject 1,000 mcg into the muscle every 30 (thirty) days.    Marland Kitchen loratadine (CLARITIN) 10 MG tablet Take 10 mg by mouth daily as needed for allergies.    . tamoxifen (NOLVADEX) 20 MG tablet TAKE 1 TABLET AT  BEDTIME. DO NOT TAKE IF YOU    ARE PREGNANT. DO NOT SKIP OR STOP A DOSE. TAKE  EXACLTY AS PRESCRIBED 270 tablet 2  . gabapentin (NEURONTIN) 100 MG capsule     . naproxen sodium (RA NAPROXEN SODIUM) 220 MG tablet Take by mouth.     No current facility-administered medications for this visit.    OBJECTIVE:  Filed Vitals:   07/24/15 1131  BP: 136/73  Pulse: 73  Temp: 95.8 F (35.4 C)     Body mass index is 27.69 kg/(m^2).    ECOG FS:1 - Symptomatic but completely ambulatory  PHYSICAL EXAM: General  status: Performance status is good.  Patient has not lost significant weight. Since last evaluation there is no significant change in the general status HEENT: No evidence of stomatitis. Sclera and conjunctivae :: No jaundice.   pale looking. Lungs: Air  entry equal on both sides.  No rhonchi.  No rales.  Cardiac: Heart sounds are normal.  No pericardial rub.  No murmur. Lymphatic system: Cervical, axillary, inguinal, lymph nodes not palpable GI: Abdomen is soft.liver and spleen not palpable.  No ascites.  Bowel sounds are normal.  No other palpable masses.  No tenderness . Lower extremity: No edema Neurological system: Higher functions, cranial nerves intact no evidence of peripheral neuropathy. Skin: No rash.  No ecchymosis.. No petechial hemorrhages Examination of both breasts.  No palpable masses.  No redness.  No tenderness.  LAB RESULTS:  CBC Latest Ref Rng 07/24/2015 01/22/2015  WBC 3.6 - 11.0 K/uL 5.6 5.9  Hemoglobin 12.0 - 16.0 g/dL 13.1 13.4  Hematocrit 35.0 - 47.0 % 38.8 39.3  Platelets 150 - 440 K/uL 154 166    Appointment on 07/24/2015  Component Date Value Ref Range Status  . WBC 07/24/2015 5.6  3.6 - 11.0 K/uL Final  . RBC 07/24/2015 3.99  3.80 - 5.20 MIL/uL Final  . Hemoglobin 07/24/2015 13.1  12.0 - 16.0 g/dL Final  . HCT 07/24/2015 38.8  35.0 - 47.0 % Final  . MCV 07/24/2015 97.3  80.0 - 100.0 fL Final  . MCH 07/24/2015 32.9  26.0 - 34.0 pg Final  . MCHC  07/24/2015 33.8  32.0 - 36.0 g/dL Final  . RDW 07/24/2015 13.1  11.5 - 14.5 % Final  . Platelets 07/24/2015 154  150 - 440 K/uL Final  . Neutrophils Relative % 07/24/2015 68   Final  . Neutro Abs 07/24/2015 3.8  1.4 - 6.5 K/uL Final  . Lymphocytes Relative 07/24/2015 23   Final  . Lymphs Abs 07/24/2015 1.3  1.0 - 3.6 K/uL Final  . Monocytes Relative 07/24/2015 8   Final  . Monocytes Absolute 07/24/2015 0.4  0.2 - 0.9 K/uL Final  . Eosinophils Relative 07/24/2015 2   Final  . Eosinophils Absolute 07/24/2015 0.1  0 - 0.7 K/uL Final  . Basophils Relative 07/24/2015 1   Final  . Basophils Absolute 07/24/2015 0.0  0 - 0.1 K/uL Final  .  Sodium 07/24/2015 140  135 - 145 mmol/L Final  . Potassium 07/24/2015 3.9  3.5 - 5.1 mmol/L Final  . Chloride 07/24/2015 106  101 - 111 mmol/L Final  . CO2 07/24/2015 29  22 - 32 mmol/L Final  . Glucose, Bld 07/24/2015 168* 65 - 99 mg/dL Final  . BUN 07/24/2015 20  6 - 20 mg/dL Final  . Creatinine, Ser 07/24/2015 0.86  0.44 - 1.00 mg/dL Final  . Calcium 07/24/2015 8.6* 8.9 - 10.3 mg/dL Final  . Total Protein 07/24/2015 6.4* 6.5 - 8.1 g/dL Final  . Albumin 07/24/2015 3.8  3.5 - 5.0 g/dL Final  . AST 07/24/2015 23  15 - 41 U/L Final  . ALT 07/24/2015 14  14 - 54 U/L Final  . Alkaline Phosphatase 07/24/2015 44  38 - 126 U/L Final  . Total Bilirubin 07/24/2015 1.0  0.3 - 1.2 mg/dL Final  . GFR calc non Af Amer 07/24/2015 >60  >60 mL/min Final  . GFR calc Af Amer 07/24/2015 >60  >60 mL/min Final   Comment: (NOTE) The eGFR has been calculated using the CKD EPI equation. This calculation has not been validated in all clinical situations. eGFR's persistently <60 mL/min signify possible Chronic Kidney Disease.   . Anion gap 07/24/2015 5  5 - 15 Final  . CA 27.29 07/24/2015 30.8  0.0 - 38.6 U/mL Final   Comment: (NOTE) [employer] [facility]/ACS methodology Performed At: [facility] [place], [place] [id number] Lindon Romp MD  ZT:[id number]        STUDIES: OBSERVATION 20-Jan-2015 10:26:00: (no IMPRESSION found)  MAM DGTL DIAG MAMMO TOMO W/CAD 20-Jan-2015 10:26:00: IMPRESSION:  No evidence of malignancy in either breast.    RECOMMENDATION:  Bilateral diagnostic mammogram in1 year is recommended.    I have discussed the findings and recommendations with the patient.  Results were also provided in writing at the conclusion of the  visit. If applicable, a reminder letter will be sent to the patient  regarding the next appointment.    BI-RADS CATEGORY  2: Benign ASSESSMENT: Carcinoma of breast.  Stage  1 c   Last mammogram was in April of 2016 Patient is finishing up her tamoxifen therapy in January of 2017  MEDICAL DECISION MAKING:  Discussion regarding extended adjuvant therapy.  Patient is beginning to extend adjuvant anti-hormonal therapy if C has any benefit documented by breast cancer index Specimen would be sent for breast cancer index assessment Next mammogram will be scheduled in April of 2017 Reevaluate patient in 6 months  Patient expressed understanding and was in agreement with this plan. She also understands that She can call clinic at any time with any questions, concerns, or complaints.    No matching staging information was found for the patient.  Forest Gleason, MD   07/26/2015 9:40 AM

## 2015-08-07 ENCOUNTER — Encounter: Payer: Self-pay | Admitting: Oncology

## 2016-01-16 ENCOUNTER — Other Ambulatory Visit: Payer: Self-pay | Admitting: *Deleted

## 2016-01-16 DIAGNOSIS — C50911 Malignant neoplasm of unspecified site of right female breast: Secondary | ICD-10-CM

## 2016-01-22 ENCOUNTER — Ambulatory Visit: Payer: Medicare Other | Admitting: Oncology

## 2016-01-22 ENCOUNTER — Other Ambulatory Visit: Payer: Medicare Other

## 2016-02-09 ENCOUNTER — Ambulatory Visit
Admission: RE | Admit: 2016-02-09 | Discharge: 2016-02-09 | Disposition: A | Discharge status: Home / Self Care | Payer: Medicare Other | Source: Ambulatory Visit | Attending: Oncology | Admitting: Oncology

## 2016-02-09 ENCOUNTER — Other Ambulatory Visit: Payer: Self-pay | Admitting: Oncology

## 2016-02-09 DIAGNOSIS — C50911 Malignant neoplasm of unspecified site of right female breast: Secondary | ICD-10-CM

## 2016-02-09 DIAGNOSIS — Z853 Personal history of malignant neoplasm of breast: Secondary | ICD-10-CM | POA: Insufficient documentation

## 2016-02-09 HISTORY — DX: Reserved for concepts with insufficient information to code with codable children: IMO0002

## 2016-02-12 ENCOUNTER — Inpatient Hospital Stay: Payer: Medicare Other | Attending: Oncology

## 2016-02-12 ENCOUNTER — Inpatient Hospital Stay (HOSPITAL_BASED_OUTPATIENT_CLINIC_OR_DEPARTMENT_OTHER): Payer: Medicare Other | Admitting: Family Medicine

## 2016-02-12 VITALS — BP 154/80 | HR 64 | Temp 98.1°F | Wt 156.1 lb

## 2016-02-12 DIAGNOSIS — E119 Type 2 diabetes mellitus without complications: Secondary | ICD-10-CM | POA: Diagnosis not present

## 2016-02-12 DIAGNOSIS — Z923 Personal history of irradiation: Secondary | ICD-10-CM | POA: Insufficient documentation

## 2016-02-12 DIAGNOSIS — Z170 Estrogen receptor positive status [ER+]: Secondary | ICD-10-CM | POA: Insufficient documentation

## 2016-02-12 DIAGNOSIS — C50411 Malignant neoplasm of upper-outer quadrant of right female breast: Secondary | ICD-10-CM

## 2016-02-12 DIAGNOSIS — Z87891 Personal history of nicotine dependence: Secondary | ICD-10-CM | POA: Insufficient documentation

## 2016-02-12 DIAGNOSIS — C50911 Malignant neoplasm of unspecified site of right female breast: Secondary | ICD-10-CM | POA: Diagnosis present

## 2016-02-12 DIAGNOSIS — C50919 Malignant neoplasm of unspecified site of unspecified female breast: Secondary | ICD-10-CM | POA: Insufficient documentation

## 2016-02-12 DIAGNOSIS — K219 Gastro-esophageal reflux disease without esophagitis: Secondary | ICD-10-CM | POA: Insufficient documentation

## 2016-02-12 DIAGNOSIS — Z79810 Long term (current) use of selective estrogen receptor modulators (SERMs): Secondary | ICD-10-CM | POA: Diagnosis not present

## 2016-02-12 LAB — CBC WITH DIFFERENTIAL/PLATELET
BASOS ABS: 0.1 10*3/uL (ref 0–0.1)
Basophils Relative: 1 %
EOS PCT: 2 %
Eosinophils Absolute: 0.1 10*3/uL (ref 0–0.7)
HEMATOCRIT: 38.3 % (ref 35.0–47.0)
Hemoglobin: 13.3 g/dL (ref 12.0–16.0)
LYMPHS PCT: 35 %
Lymphs Abs: 2.3 10*3/uL (ref 1.0–3.6)
MCH: 33.3 pg (ref 26.0–34.0)
MCHC: 34.6 g/dL (ref 32.0–36.0)
MCV: 96.2 fL (ref 80.0–100.0)
MONO ABS: 0.6 10*3/uL (ref 0.2–0.9)
MONOS PCT: 9 %
NEUTROS ABS: 3.4 10*3/uL (ref 1.4–6.5)
Neutrophils Relative %: 53 %
PLATELETS: 161 10*3/uL (ref 150–440)
RBC: 3.98 MIL/uL (ref 3.80–5.20)
RDW: 13.3 % (ref 11.5–14.5)
WBC: 6.5 10*3/uL (ref 3.6–11.0)

## 2016-02-12 LAB — COMPREHENSIVE METABOLIC PANEL
ALT: 14 U/L (ref 14–54)
ANION GAP: 7 (ref 5–15)
AST: 19 U/L (ref 15–41)
Albumin: 4.1 g/dL (ref 3.5–5.0)
Alkaline Phosphatase: 56 U/L (ref 38–126)
BILIRUBIN TOTAL: 0.8 mg/dL (ref 0.3–1.2)
BUN: 15 mg/dL (ref 6–20)
CHLORIDE: 107 mmol/L (ref 101–111)
CO2: 28 mmol/L (ref 22–32)
Calcium: 9.4 mg/dL (ref 8.9–10.3)
Creatinine, Ser: 0.79 mg/dL (ref 0.44–1.00)
Glucose, Bld: 92 mg/dL (ref 65–99)
POTASSIUM: 3.9 mmol/L (ref 3.5–5.1)
Sodium: 142 mmol/L (ref 135–145)
TOTAL PROTEIN: 6.8 g/dL (ref 6.5–8.1)

## 2016-02-12 NOTE — Progress Notes (Signed)
[place]  Telephone:([phone]) [phone]  Fax:(336) [phone]     Cheryl Diaz DOB: 28-Jan-1936  MR#: [id number]  PRF#:[id number]  Patient Care Team: Kirk Ruths, MD as PCP - General (Internal Medicine)  CHIEF COMPLAINT:  Chief Complaint  Patient presents with  . Malignant neoplasm of female breast, unspecified laterality,    INTERVAL HISTORY:  Patient is here for further follow-up regarding carcinoma of right breast. Patient is currently on tamoxifen daily and tolerating very well. Patient had mammogram on May 1 which was reported as BI-RADS 1, negative. Patient having questions as to whether she could continue tamoxifen for an extended adjuvant therapy. She overall reports feeling fairly well except for chronic joint pain related to arthritis. She does see Dr. Precious Reel. She does on occasion use Aleve for arthritic pain.  REVIEW OF SYSTEMS:   Review of Systems  Constitutional: Negative for fever, chills, weight loss, malaise/fatigue and diaphoresis.  HENT: Negative.   Eyes: Negative.   Respiratory: Negative for cough, hemoptysis, sputum production, shortness of breath and wheezing.   Cardiovascular: Negative for chest pain, palpitations, orthopnea, claudication, leg swelling and PND.  Gastrointestinal: Negative for heartburn, nausea, vomiting, abdominal pain, diarrhea, constipation, blood in stool and melena.  Genitourinary: Negative.   Musculoskeletal: Positive for joint pain.       Related to arthritis  Skin: Negative.   Neurological: Negative for dizziness, tingling, focal weakness, seizures and weakness.  Endo/Heme/Allergies: Does not bruise/bleed easily.  Psychiatric/Behavioral: Negative for depression. The patient is not nervous/anxious and does not have insomnia.     As per HPI. Otherwise, a complete review of systems is negatve.  ONCOLOGY HISTORY:   Breast cancer in female [facility])   10/14/2010 Initial Diagnosis Right Breast cancer in female  [facility]), stage IC T1 cN0 M0. ER PR positive HER-2 negative by [facility]   10/14/2010 Oncotype testing 15    - 02/12/2011 Radiation Therapy    02/12/2011 - 05/15/2011 Anti-estrogen oral therapy Started Arimidex   05/15/2011 -  Anti-estrogen oral therapy Discontinued Arimidex and started on tamoxifen due to poor tolerance.    PAST MEDICAL HISTORY: Past Medical History  Diagnosis Date  . Arthritis   . GERD (gastroesophageal reflux disease)   . Anemia   . Breast cancer ([place]) 10/2010    ER/PR+, Her2neu- RT LUMPECTOMY  . Radiation 2011    RIGHT BREAST CA    PAST SURGICAL HISTORY: Past Surgical History  Procedure Laterality Date  . Carpal tunnel release      FAMILY HISTORY Family History  Problem Relation Age of Onset  . Breast cancer Neg Hx     GYNECOLOGIC HISTORY:  No LMP recorded. Patient is postmenopausal.     ADVANCED DIRECTIVES:    HEALTH MAINTENANCE: Social History  Substance Use Topics  . Smoking status: Former Research scientist (life sciences)  . Smokeless tobacco: Not on file  . Alcohol Use: Not on file     Colonoscopy:  PAP:  Bone density:  Mammogram: May 2017  Allergies  Allergen Reactions  . Diclofenac Other (See Comments)  . Penicillin G Other (See Comments)    lumps and swelling, hives  . Cephalosporins Rash    Current Outpatient Prescriptions  Medication Sig Dispense Refill  . acetaminophen (TYLENOL) 325 MG tablet Take 650 mg by mouth every 6 (six) hours as needed.    . Calcium Carb-Cholecalciferol (CALCIUM 600 + D PO) Take 1 tablet by mouth daily.    . cyanocobalamin (,VITAMIN B-12,) 1000 MCG/ML injection Inject 1,000 mcg into  the muscle every 30 (thirty) days.    Marland Kitchen loratadine (CLARITIN) 10 MG tablet Take 10 mg by mouth daily as needed for allergies.    . naproxen sodium (RA NAPROXEN SODIUM) 220 MG tablet Take by mouth.    . tamoxifen (NOLVADEX) 20 MG tablet TAKE 1 TABLET AT BEDTIME. DO NOT TAKE IF YOU    ARE PREGNANT. DO NOT SKIP OR STOP A DOSE. TAKE  EXACLTY AS PRESCRIBED 270 tablet 2    No current facility-administered medications for this visit.    OBJECTIVE: BP 154/80 mmHg  Pulse 64  Temp(Src) 98.1 F (36.7 C) (Tympanic)  Wt 156 lb 1.4 oz (70.8 kg)   Body mass index is 28.54 kg/(m^2).    ECOG FS:0 - Asymptomatic  General: Well-developed, well-nourished, no acute distress. Eyes: Pink conjunctiva, anicteric sclera. HEENT: Normocephalic, moist mucous membranes, clear oropharnyx. Lungs: Clear to auscultation bilaterally. Heart: Regular rate and rhythm. No rubs, murmurs, or gallops. Abdomen: Soft, nontender, nondistended. No organomegaly noted, normoactive bowel sounds. Breast: Breast palpated in a circular manner in the sitting and supine positions.  No masses or fullness palpated.  Axilla palpated in both positions with no masses or fullness palpated.  Musculoskeletal: No edema, cyanosis, or clubbing. Neuro: Alert, answering all questions appropriately. Cranial nerves grossly intact. Skin: No rashes or petechiae noted. Psych: Normal affect. Lymphatics: No cervical, clavicular, or axillary LAD.   LAB RESULTS:  Appointment on 02/12/2016  Component Date Value Ref Range Status  . WBC 02/12/2016 6.5  3.6 - 11.0 K/uL Final  . RBC 02/12/2016 3.98  3.80 - 5.20 MIL/uL Final  . Hemoglobin 02/12/2016 13.3  12.0 - 16.0 g/dL Final  . HCT 02/12/2016 38.3  35.0 - 47.0 % Final  . MCV 02/12/2016 96.2  80.0 - 100.0 fL Final  . MCH 02/12/2016 33.3  26.0 - 34.0 pg Final  . MCHC 02/12/2016 34.6  32.0 - 36.0 g/dL Final  . RDW 02/12/2016 13.3  11.5 - 14.5 % Final  . Platelets 02/12/2016 161  150 - 440 K/uL Final  . Neutrophils Relative % 02/12/2016 53   Final  . Neutro Abs 02/12/2016 3.4  1.4 - 6.5 K/uL Final  . Lymphocytes Relative 02/12/2016 35   Final  . Lymphs Abs 02/12/2016 2.3  1.0 - 3.6 K/uL Final  . Monocytes Relative 02/12/2016 9   Final  . Monocytes Absolute 02/12/2016 0.6  0.2 - 0.9 K/uL Final  . Eosinophils Relative 02/12/2016 2   Final  . Eosinophils Absolute  02/12/2016 0.1  0 - 0.7 K/uL Final  . Basophils Relative 02/12/2016 1   Final  . Basophils Absolute 02/12/2016 0.1  0 - 0.1 K/uL Final  . Sodium 02/12/2016 142  135 - 145 mmol/L Final  . Potassium 02/12/2016 3.9  3.5 - 5.1 mmol/L Final  . Chloride 02/12/2016 107  101 - 111 mmol/L Final  . CO2 02/12/2016 28  22 - 32 mmol/L Final  . Glucose, Bld 02/12/2016 92  65 - 99 mg/dL Final  . BUN 02/12/2016 15  6 - 20 mg/dL Final  . Creatinine, Ser 02/12/2016 0.79  0.44 - 1.00 mg/dL Final  . Calcium 02/12/2016 9.4  8.9 - 10.3 mg/dL Final  . Total Protein 02/12/2016 6.8  6.5 - 8.1 g/dL Final  . Albumin 02/12/2016 4.1  3.5 - 5.0 g/dL Final  . AST 02/12/2016 19  15 - 41 U/L Final  . ALT 02/12/2016 14  14 - 54 U/L Final  . Alkaline Phosphatase 02/12/2016 56  38 - 126 U/L Final  . Total Bilirubin 02/12/2016 0.8  0.3 - 1.2 mg/dL Final  . GFR calc non Af Amer 02/12/2016 >60  >60 mL/min Final  . GFR calc Af Amer 02/12/2016 >60  >60 mL/min Final   Comment: (NOTE) The eGFR has been calculated using the CKD EPI equation. This calculation has not been validated in all clinical situations. eGFR's persistently <60 mL/min signify possible Chronic Kidney Disease.   . Anion gap 02/12/2016 7  5 - 15 Final    STUDIES: Mm Diag Breast Tomo Bilateral  02/10/2016  ADDENDUM REPORT: 02/10/2016 08:39 ADDENDUM: Correction to recommendation: Bilateral diagnostic mammogram is suggested in 1 year. (Code:DM-B-01Y) Electronically Signed   By: Ammie Ferrier M.D.   On: 02/10/2016 08:39  02/10/2016  ADDENDUM REPORT: 02/10/2016 08:39 ADDENDUM: Correction to recommendation: Bilateral diagnostic mammogram is suggested in 1 year. (Code:DM-B-01Y) Electronically Signed   By: Ammie Ferrier M.D.   On: 02/10/2016 08:39  02/10/2016  CLINICAL DATA:  Routine postlumpectomy follow-up status post right breast lumpectomy in December of 2011. EXAM: 2D DIGITAL DIAGNOSTIC BILATERAL MAMMOGRAM WITH CAD AND ADJUNCT TOMO COMPARISON:  Previous  exam(s). ACR Breast Density Category c: The breast tissue is heterogeneously dense, which may obscure small masses. FINDINGS: Stable right breast lumpectomy site. No suspicious calcifications, masses or areas of distortion are seen in the bilateral breasts. Mammographic images were processed with CAD. IMPRESSION: Stable right breast lumpectomy site. No mammographic evidence of malignancy in the bilateral breasts. RECOMMENDATION: Diagnostic mammogram of the left breast. (Code:FI-L-24M) I have discussed the findings and recommendations with the patient. Results were also provided in writing at the conclusion of the visit. If applicable, a reminder letter will be sent to the patient regarding the next appointment. BI-RADS CATEGORY  2: Benign. Electronically Signed: By: Ammie Ferrier M.D. On: 02/09/2016 11:04    ASSESSMENT:  Carcinoma of right breast, stage I cT1 cN0 M0, ER PR positive HER-2 negative by FISH.  PLAN:   1. Carcinoma of right breast. Patient is status post lumpectomy and radiation. Patient with recent mammogram on May 1 reported as negative, BI-RADS 1. Patient expressed desire to continue with tamoxifen daily for at least a couple of more years. She states this makes her feel more comfortable and safe. Clinically there is no evidence to suggest recurrent disease. Advised patient that we could continue with tamoxifen for at least 2 more years and then rediscuss at that time. Patient will need to schedule next mammogram during follow-up visit due to scheduling issues with mammography. We will continue with routine follow-up in approximately 6 months.  Patient expressed understanding and was in agreement with this plan. She also understands that She can call clinic at any time with any questions, concerns, or complaints.   Dr. Oliva Bustard was available for consultation and review of plan of care for this patient.  Carcinoma of right breast, stage I cT1 cN0 M0, ER PR positive HER-2 negative by  FISH. Evlyn Kanner, NP   02/12/2016 2:00 PM

## 2016-03-30 ENCOUNTER — Telehealth: Payer: Self-pay | Admitting: Family Medicine

## 2016-03-30 ENCOUNTER — Other Ambulatory Visit: Payer: Self-pay | Admitting: Family Medicine

## 2016-03-30 DIAGNOSIS — C50919 Malignant neoplasm of unspecified site of unspecified female breast: Secondary | ICD-10-CM

## 2016-03-30 NOTE — Telephone Encounter (Signed)
Explained dense breast tissue to patient. No further questions.

## 2016-03-30 NOTE — Telephone Encounter (Signed)
-----   Message from Cephus Richer sent at 03/30/2016  1:48 PM EDT ----- Contact: [phone] Per pt received a letter from [facility] ...stating that she had dense tissue.  PT want to know what this mean. Please call

## 2016-06-30 ENCOUNTER — Other Ambulatory Visit: Payer: Medicare Other

## 2016-06-30 ENCOUNTER — Ambulatory Visit: Payer: Medicare Other | Admitting: Hematology and Oncology

## 2016-08-20 ENCOUNTER — Other Ambulatory Visit: Payer: Medicare Other

## 2016-08-20 ENCOUNTER — Ambulatory Visit: Payer: Medicare Other | Admitting: Family Medicine

## 2016-08-23 ENCOUNTER — Other Ambulatory Visit: Payer: Medicare Other

## 2016-08-23 ENCOUNTER — Ambulatory Visit: Payer: Medicare Other | Admitting: Hematology and Oncology

## 2016-08-23 ENCOUNTER — Ambulatory Visit: Payer: Medicare Other

## 2016-08-24 ENCOUNTER — Emergency Department
Admission: EM | Admit: 2016-08-24 | Discharge: 2016-08-24 | Disposition: A | Discharge status: Home / Self Care | Payer: Medicare Other | Attending: Emergency Medicine | Admitting: Emergency Medicine

## 2016-08-24 ENCOUNTER — Other Ambulatory Visit: Payer: Self-pay | Admitting: *Deleted

## 2016-08-24 ENCOUNTER — Emergency Department: Payer: Medicare Other

## 2016-08-24 DIAGNOSIS — R0602 Shortness of breath: Secondary | ICD-10-CM | POA: Diagnosis present

## 2016-08-24 DIAGNOSIS — Z87891 Personal history of nicotine dependence: Secondary | ICD-10-CM | POA: Diagnosis not present

## 2016-08-24 DIAGNOSIS — Z79899 Other long term (current) drug therapy: Secondary | ICD-10-CM | POA: Insufficient documentation

## 2016-08-24 DIAGNOSIS — J069 Acute upper respiratory infection, unspecified: Secondary | ICD-10-CM | POA: Diagnosis not present

## 2016-08-24 DIAGNOSIS — Z853 Personal history of malignant neoplasm of breast: Secondary | ICD-10-CM | POA: Diagnosis not present

## 2016-08-24 LAB — COMPREHENSIVE METABOLIC PANEL
ALK PHOS: 50 U/L (ref 38–126)
ALT: 14 U/L (ref 14–54)
ANION GAP: 10 (ref 5–15)
AST: 27 U/L (ref 15–41)
Albumin: 3.7 g/dL (ref 3.5–5.0)
BILIRUBIN TOTAL: 0.7 mg/dL (ref 0.3–1.2)
BUN: 16 mg/dL (ref 6–20)
CALCIUM: 8.6 mg/dL — AB (ref 8.9–10.3)
CO2: 22 mmol/L (ref 22–32)
Chloride: 108 mmol/L (ref 101–111)
Creatinine, Ser: 0.78 mg/dL (ref 0.44–1.00)
GLUCOSE: 116 mg/dL — AB (ref 65–99)
Potassium: 3.5 mmol/L (ref 3.5–5.1)
Sodium: 140 mmol/L (ref 135–145)
TOTAL PROTEIN: 6.3 g/dL — AB (ref 6.5–8.1)

## 2016-08-24 LAB — CBC
HEMATOCRIT: 38.4 % (ref 35.0–47.0)
HEMOGLOBIN: 13.4 g/dL (ref 12.0–16.0)
MCH: 34.1 pg — AB (ref 26.0–34.0)
MCHC: 34.9 g/dL (ref 32.0–36.0)
MCV: 97.7 fL (ref 80.0–100.0)
Platelets: 135 10*3/uL — ABNORMAL LOW (ref 150–440)
RBC: 3.93 MIL/uL (ref 3.80–5.20)
RDW: 13 % (ref 11.5–14.5)
WBC: 5.2 10*3/uL (ref 3.6–11.0)

## 2016-08-24 LAB — TROPONIN I

## 2016-08-24 LAB — BRAIN NATRIURETIC PEPTIDE: B NATRIURETIC PEPTIDE 5: 14 pg/mL (ref 0.0–100.0)

## 2016-08-24 MED ORDER — IPRATROPIUM-ALBUTEROL 0.5-2.5 (3) MG/3ML IN SOLN
3.0000 mL | Freq: Once | RESPIRATORY_TRACT | Status: AC
  Administered 2016-08-24: 3 mL via RESPIRATORY_TRACT

## 2016-08-24 MED ORDER — AZITHROMYCIN 500 MG PO TABS: 500.0000 mg | ORAL_TABLET | Freq: Every day | ORAL | 0 refills | Status: AC

## 2016-08-24 MED ORDER — AZITHROMYCIN 500 MG PO TABS
500.0000 mg | ORAL_TABLET | Freq: Once | ORAL | Status: AC
  Administered 2016-08-24: 500 mg via ORAL
  Filled 2016-08-24: qty 1

## 2016-08-24 MED ORDER — IPRATROPIUM-ALBUTEROL 0.5-2.5 (3) MG/3ML IN SOLN
RESPIRATORY_TRACT | Status: AC
  Administered 2016-08-24: 3 mL via RESPIRATORY_TRACT
  Filled 2016-08-24: qty 3

## 2016-08-24 NOTE — ED Provider Notes (Signed)
[facility] Emergency Department Provider Note   ____________________________________________    I have reviewed the triage vital signs and the nursing notes.   HISTORY  Chief Complaint Cough     HPI Cheryl Diaz is a 80 y.o. female who presents with complaints of cough and shortness of breath. Patient reports she started coughing vigorously and had a hard time catching her breath. She denies fevers or chills. She reports she has had a cough for approximately 5 days now. No recent travel. No calf pain or swelling. She describes some chest congestion/discomfort in the right chest. No history of blood clots. No diaphoresis or nausea vomiting   Past Medical History:  Diagnosis Date  . Anemia   . Arthritis   . Breast cancer ([place]) 10/2010   ER/PR+, Her2neu- RT LUMPECTOMY  . GERD (gastroesophageal reflux disease)   . Radiation 2011   RIGHT BREAST CA    Patient Active Problem List   Diagnosis Date Noted  . Breast cancer in female [facility]) 02/12/2016  . [place] anemia 06/04/2014  . Acid reflux 05/17/2014  . Benign hypertension 05/17/2014  . Blood glucose elevated 05/17/2014  . HLD (hyperlipidemia) 05/17/2014    Past Surgical History:  Procedure Laterality Date  . CARPAL TUNNEL RELEASE      Prior to Admission medications   Medication Sig Start Date End Date Taking? Authorizing Provider  acetaminophen (TYLENOL) 325 MG tablet Take 650 mg by mouth every 6 (six) hours as needed.    Historical Provider, MD  Calcium Carb-Cholecalciferol (CALCIUM 600 + D PO) Take 1 tablet by mouth daily.    Historical Provider, MD  cyanocobalamin (,VITAMIN B-12,) 1000 MCG/ML injection Inject 1,000 mcg into the muscle every 30 (thirty) days.    Historical Provider, MD  loratadine (CLARITIN) 10 MG tablet Take 10 mg by mouth daily as needed for allergies.    Historical Provider, MD  naproxen sodium (RA NAPROXEN SODIUM) 220 MG tablet Take by mouth.    Historical  Provider, MD  tamoxifen (NOLVADEX) 20 MG tablet TAKE 1 TABLET AT BEDTIME. DO NOT TAKE IF YOU    ARE PREGNANT. DO NOT SKIP OR STOP A DOSE. TAKE  EXACLTY AS PRESCRIBED 03/25/15   Evlyn Kanner, NP     Allergies Diclofenac; Penicillin g; and Cephalosporins  Family History  Problem Relation Age of Onset  . Breast cancer Neg Hx     Social History Social History  Substance Use Topics  . Smoking status: Former Research scientist (life sciences)  . Smokeless tobacco: Not on file  . Alcohol use Not on file    Review of Systems  Constitutional: No fever/chills Eyes: No visual changes.   Cardiovascular: As above Respiratory: As above Gastrointestinal: No abdominal pain.  No nausea, no vomiting.   Genitourinary: Negative for dysuria. Musculoskeletal: Negative for back pain. Skin: Negative for rash. Neurological: Negative for headaches or weakness  10-point ROS otherwise negative.  ____________________________________________   PHYSICAL EXAM:  VITAL SIGNS: ED Triage Vitals  Enc Vitals Group     BP 08/24/16 0214 (!) 159/84     Pulse Rate 08/24/16 0214 88     Resp 08/24/16 0214 20     Temp 08/24/16 0214 97.8 F (36.6 C)     Temp Source 08/24/16 0214 Oral     SpO2 08/24/16 0214 97 %     Weight 08/24/16 0213 150 lb (68 kg)     Height 08/24/16 0213 5\' 2"  (1.575 m)     Head Circumference --  Peak Flow --      Pain Score --      Pain Loc --      Pain Edu? --      Excl. in [place]? --     Constitutional: Alert and oriented. No acute distress. Pleasant and interactive Eyes: Conjunctivae are normal.   Nose: No congestion/rhinnorhea. Mouth/Throat: Mucous membranes are moist.    Cardiovascular: Normal rate, regular rhythm. Grossly normal heart sounds.  Good peripheral circulation. Respiratory: Normal respiratory effort.  No retractions. Lungs CTAB. Gastrointestinal: Soft and nontender. No distention.  No CVA tenderness. Genitourinary: deferred Musculoskeletal: No lower extremity tenderness nor  edema.  Warm and well perfused Neurologic:  Normal speech and language. No gross focal neurologic deficits are appreciated.  Skin:  Skin is warm, dry and intact. No rash noted. Psychiatric: Mood and affect are normal. Speech and behavior are normal.  ____________________________________________   LABS (all labs ordered are listed, but only abnormal results are displayed)  Labs Reviewed  CBC  COMPREHENSIVE METABOLIC PANEL  TROPONIN I  BRAIN NATRIURETIC PEPTIDE   ____________________________________________  EKG  ED ECG REPORT I, Lavonia Drafts, the attending physician, personally viewed and interpreted this ECG.  Date: 08/24/2016 EKG Time: 2:49 AM Rate: 80 Rhythm: normal sinus rhythm QRS Axis: normal Intervals: normal ST/T Wave abnormalities: Nonspecific changes Conduction Disturbances: none   ____________________________________________  RADIOLOGY  Chest x-ray unremarkable ____________________________________________   PROCEDURES  Procedure(s) performed: No    Critical Care performed: No ____________________________________________   INITIAL IMPRESSION / ASSESSMENT AND PLAN / ED COURSE  Pertinent labs & imaging results that were available during my care of the patient were reviewed by me and considered in my medical decision making (see chart for details).  Patient presents with complaints of mild shortness of breath and cough. Overall she is well-appearing and in no distress. We will check labs, chest x-ray, EKG and reevaluate.  Clinical Course   ----------------------------------------- 4:20 AM on 08/24/2016 -----------------------------------------  Workup is reassuring, labs are normal. Chest x-ray is clear. Patient feels better after DuoNeb. Vitals remain reassuring. Most consistent with acute upper respiratory infection/bronchitis. Patient to follow-up with PCP. Return precautions discussed at  length. ____________________________________________   FINAL CLINICAL IMPRESSION(S) / ED DIAGNOSES  Final diagnoses:  Acute upper respiratory infection      NEW MEDICATIONS STARTED DURING THIS VISIT:  New Prescriptions   No medications on file     Note:  This document was prepared using Dragon voice recognition software and may include unintentional dictation errors.    Lavonia Drafts, MD 08/24/16 [phone]

## 2016-08-24 NOTE — ED Triage Notes (Signed)
Patient ambulatory to triage with steady gait, without difficulty or distress noted; pt reports since last Wed having nonprod cough

## 2016-08-24 NOTE — ED Notes (Signed)
Patient transported to X-ray 

## 2016-08-25 ENCOUNTER — Ambulatory Visit: Payer: Medicare Other | Admitting: Hematology and Oncology

## 2016-08-25 ENCOUNTER — Other Ambulatory Visit: Payer: Medicare Other

## 2016-09-15 ENCOUNTER — Inpatient Hospital Stay: Payer: Medicare Other | Attending: Hematology and Oncology

## 2016-09-15 ENCOUNTER — Inpatient Hospital Stay (HOSPITAL_BASED_OUTPATIENT_CLINIC_OR_DEPARTMENT_OTHER): Payer: Medicare Other | Admitting: Hematology and Oncology

## 2016-09-15 VITALS — BP 134/80 | HR 73 | Temp 98.3°F | Resp 18 | Wt 151.0 lb

## 2016-09-15 DIAGNOSIS — C50919 Malignant neoplasm of unspecified site of unspecified female breast: Secondary | ICD-10-CM

## 2016-09-15 DIAGNOSIS — Z87891 Personal history of nicotine dependence: Secondary | ICD-10-CM | POA: Diagnosis not present

## 2016-09-15 DIAGNOSIS — M1990 Unspecified osteoarthritis, unspecified site: Secondary | ICD-10-CM | POA: Diagnosis not present

## 2016-09-15 DIAGNOSIS — Z923 Personal history of irradiation: Secondary | ICD-10-CM

## 2016-09-15 DIAGNOSIS — Z79810 Long term (current) use of selective estrogen receptor modulators (SERMs): Secondary | ICD-10-CM | POA: Diagnosis not present

## 2016-09-15 DIAGNOSIS — K219 Gastro-esophageal reflux disease without esophagitis: Secondary | ICD-10-CM | POA: Insufficient documentation

## 2016-09-15 DIAGNOSIS — Z9011 Acquired absence of right breast and nipple: Secondary | ICD-10-CM | POA: Insufficient documentation

## 2016-09-15 DIAGNOSIS — Z170 Estrogen receptor positive status [ER+]: Secondary | ICD-10-CM | POA: Insufficient documentation

## 2016-09-15 DIAGNOSIS — C50911 Malignant neoplasm of unspecified site of right female breast: Secondary | ICD-10-CM

## 2016-09-15 DIAGNOSIS — Z853 Personal history of malignant neoplasm of breast: Secondary | ICD-10-CM

## 2016-09-15 LAB — CBC WITH DIFFERENTIAL/PLATELET
Basophils Absolute: 0.1 10*3/uL (ref 0–0.1)
Basophils Relative: 1 %
Eosinophils Absolute: 0.1 10*3/uL (ref 0–0.7)
Eosinophils Relative: 2 %
HCT: 38.2 % (ref 35.0–47.0)
Hemoglobin: 12.8 g/dL (ref 12.0–16.0)
Lymphocytes Relative: 30 %
Lymphs Abs: 1.9 10*3/uL (ref 1.0–3.6)
MCH: 32.8 pg (ref 26.0–34.0)
MCHC: 33.5 g/dL (ref 32.0–36.0)
MCV: 98 fL (ref 80.0–100.0)
Monocytes Absolute: 0.6 10*3/uL (ref 0.2–0.9)
Monocytes Relative: 9 %
Neutro Abs: 3.7 10*3/uL (ref 1.4–6.5)
Neutrophils Relative %: 58 %
Platelets: 217 10*3/uL (ref 150–440)
RBC: 3.9 MIL/uL (ref 3.80–5.20)
RDW: 12.5 % (ref 11.5–14.5)
WBC: 6.3 10*3/uL (ref 3.6–11.0)

## 2016-09-15 LAB — COMPREHENSIVE METABOLIC PANEL
ALT: 12 U/L — ABNORMAL LOW (ref 14–54)
AST: 18 U/L (ref 15–41)
Albumin: 3.7 g/dL (ref 3.5–5.0)
Alkaline Phosphatase: 48 U/L (ref 38–126)
Anion gap: 6 (ref 5–15)
BUN: 11 mg/dL (ref 6–20)
CO2: 28 mmol/L (ref 22–32)
Calcium: 9.1 mg/dL (ref 8.9–10.3)
Chloride: 106 mmol/L (ref 101–111)
Creatinine, Ser: 0.74 mg/dL (ref 0.44–1.00)
GFR calc Af Amer: >60 mL/min (ref >60)
GFR calc non Af Amer: >60 mL/min (ref >60)
Glucose, Bld: 92 mg/dL (ref 65–99)
Potassium: 3.7 mmol/L (ref 3.5–5.1)
Sodium: 140 mmol/L (ref 135–145)
Total Bilirubin: 0.9 mg/dL (ref 0.3–1.2)
Total Protein: 6.6 g/dL (ref 6.5–8.1)

## 2016-09-15 NOTE — Progress Notes (Signed)
Patient states she has had problems with congestion/SOB for past few weeks.  She has been on 2 Z-Paks and now is on Doxycycline.  Former patient of Dr. Oliva Bustard.  Last seen by Georgeanne Nim, NP for right breast cancer Patient has not had any new diagnosis since last visit.

## 2016-09-15 NOTE — Progress Notes (Signed)
[facility] day:  09/15/2016  Chief Complaint: Cheryl Diaz is a 80 y.o. female with stage I right breast cancer who is seen for reassessment.  HPI: She was diagnosed with right breast cancer in 2011 after presenting with an abnormal screening mammogram.  Mammogram on 09/04/2010 revealed a small spiculated mass with pleomorphic microcalcifications in the right breast.  Ultrasound confirmed a 6 mm hypoechoic mass heterogeneous shadowing o'clock position.  Lumpectomy and sentinel lymph node biopsy on 10/06/2010 by Dr. Rochel Brome revealed a 1.3 cm grade II invasive ductal carcinoma. 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 There was no lymphovascular invasion. Tumor was ER + (> 90%) PR + (> 90%) and HER-2/neu - by FISH.  Pathologic stage was T1c Nx.    She underwent right partial mastectomy (for margins) and sentinel lymph node biopsy on 10/21/2010.  There was no evidence of residual in situ or invasive carcinoma. There was fibrocystic changes and ductal hyperplasia.  Lymph node biopsy revealed no macrometastasis.  Oncotype DX score was 15.  She was treated with the radiation.  He received Arimidex from 02/12/2011 - 05/15/2011. Arimidex was discontinued secondary to poor tolerance.  She was started on tamoxifen on 05/15/2011.  BCI testing on 08/01/2015 revealed a 4.3% risk of late recurrence between years 5 and 10 (low risk category) with a high likelihood of benefit for extended adjuvant therapy.  Bilateral diagnostic mammogram on 02/09/2016 revealed a stable right breast lumpectomy site and no evidence of malignancy.  She was seen in the [facility] on 08/24/2016.  She had a persistent cough.  She has been treated with a Z-pack x 2.    Symptomatically, she is doing well. Her memory is poor. She has some arthritis in her thumbs.  She is followed for scoliosis in the [place] clinic.  She  denies any breast concerns.   Past Medical History:  Diagnosis Date  . Anemia   . Arthritis   . Breast cancer ([place]) 10/2010   ER/PR+, Her2neu- RT LUMPECTOMY  . GERD (gastroesophageal reflux disease)   . Radiation 2011   RIGHT BREAST CA    Past Surgical History:  Procedure Laterality Date  . CARPAL TUNNEL RELEASE      Family History  Problem Relation Age of Onset  . Breast cancer Neg Hx     Social History:  reports that she has quit smoking. She does not have any smokeless tobacco history on file. Her alcohol and drug histories are not on file.  She lives in [place].  Her husband is handicapped and is almost totally blind.  The patient lives in [place].  The patient is alone today.  Allergies:  Allergies  Allergen Reactions  . Diclofenac Other (See Comments)  . Penicillin G Other (See Comments)    lumps and swelling, hives  . Cephalosporins Rash    Current Medications: Current Outpatient Prescriptions  Medication Sig Dispense Refill  . acetaminophen (TYLENOL) 325 MG tablet Take 650 mg by mouth every 6 (six) hours as needed.    . Calcium Carb-Cholecalciferol (CALCIUM 600 + D PO) Take 1 tablet by mouth daily.    . cyanocobalamin (,VITAMIN B-12,) 1000 MCG/ML injection Inject 1,000 mcg into the muscle every 30 (thirty) days.    Marland Kitchen doxycycline (VIBRA-TABS) 100 MG tablet Take by mouth.    . Fluticasone-Salmeterol (ADVAIR DISKUS) 250-50 MCG/DOSE AEPB Inhale into the lungs every 12 (twelve) hours  as needed.    . naproxen sodium (RA NAPROXEN SODIUM) 220 MG tablet Take by mouth.    . tamoxifen (NOLVADEX) 20 MG tablet TAKE 1 TABLET AT BEDTIME. DO NOT TAKE IF YOU    ARE PREGNANT. DO NOT SKIP OR STOP A DOSE. TAKE  EXACLTY AS PRESCRIBED 270 tablet 2  . loratadine (CLARITIN) 10 MG tablet Take 10 mg by mouth daily as needed for allergies.     No current facility-administered medications for this visit.     Review of Systems:  GENERAL:  Feels good.  Active.  No fevers, sweats or weight  loss. PERFORMANCE STATUS (ECOG):  1 HEENT:  No visual changes, runny nose, sore throat, mouth sores or tenderness. Lungs: No shortness of breath.  Cough (see HPI).  No hemoptysis. Cardiac:  No chest pain, palpitations, orthopnea, or PND. GI:  No nausea, vomiting, diarrhea, constipation, melena or hematochezia. GU:  No urgency, frequency, dysuria, or hematuria. Musculoskeletal:  Scoliosis.  No back pain.  Arthritis in thumbs (right > left).  No muscle tenderness. Extremities:  No pain or swelling. Skin:  No rashes or skin changes. Neuro:  Poor memory.  No headache, numbness or weakness, balance or coordination issues. Endocrine:  No diabetes, thyroid issues, hot flashes or night sweats. Psych:  No mood changes, depression or anxiety. Pain:  No focal pain. Review of systems:  All other systems reviewed and found to be negative.  Physical Exam: Blood pressure 134/80, pulse 73, temperature 98.3 F (36.8 C), temperature source Tympanic, resp. rate 18, weight 151 lb 0.2 oz (68.5 kg). GENERAL:  Well developed, well nourished, woman sitting comfortably in the exam room in no acute distress. MENTAL STATUS:  Alert and oriented to person, place and time. HEAD: Frosted shoulder length hair.  Normocephalic, atraumatic, face symmetric, no Cushingoid features. EYES:  Glasses.  Blue eyes.  Pupils equal round and reactive to light and accomodation.  No conjunctivitis or scleral icterus. ENT:  Oropharynx clear without lesion.  Tongue normal.  Partial.  Mucous membranes moist.  RESPIRATORY:  Clear to auscultation without rales, wheezes or rhonchi. CARDIOVASCULAR:  Regular rate and rhythm without murmur, rub or gallop. BREAST:  Right breast with post-operative and post-radiation changes.  Semi-circular incision.  Inverted nipple.  No discrete masses, skin changes or nipple discharge.  Left breast with fibrocystic changes at 3 pm and inferiorly.  No masses, skin changes or nipple discharge.  ABDOMEN:  Soft,  non-tender, with active bowel sounds, and no hepatosplenomegaly.  No masses. SKIN:  No rashes, ulcers or lesions. EXTREMITIES: No edema, no skin discoloration or tenderness.  No palpable cords. LYMPH NODES: No palpable cervical, supraclavicular, axillary or inguinal adenopathy  NEUROLOGICAL: Unremarkable. PSYCH:  Appropriate.   Appointment on 09/15/2016  Component Date Value Ref Range Status  . WBC 09/15/2016 6.3  3.6 - 11.0 K/uL Final  . RBC 09/15/2016 3.90  3.80 - 5.20 MIL/uL Final  . Hemoglobin 09/15/2016 12.8  12.0 - 16.0 g/dL Final  . HCT 74/93/5521 38.2  35.0 - 47.0 % Final  . MCV 09/15/2016 98.0  80.0 - 100.0 fL Final  . MCH 09/15/2016 32.8  26.0 - 34.0 pg Final  . MCHC 09/15/2016 33.5  32.0 - 36.0 g/dL Final  . RDW 74/71/5953 12.5  11.5 - 14.5 % Final  . Platelets 09/15/2016 217  150 - 440 K/uL Final  . Neutrophils Relative % 09/15/2016 58  % Final  . Neutro Abs 09/15/2016 3.7  1.4 - 6.5 K/uL Final  .  Lymphocytes Relative 09/15/2016 30  % Final  . Lymphs Abs 09/15/2016 1.9  1.0 - 3.6 K/uL Final  . Monocytes Relative 09/15/2016 9  % Final  . Monocytes Absolute 09/15/2016 0.6  0.2 - 0.9 K/uL Final  . Eosinophils Relative 09/15/2016 2  % Final  . Eosinophils Absolute 09/15/2016 0.1  0 - 0.7 K/uL Final  . Basophils Relative 09/15/2016 1  % Final  . Basophils Absolute 09/15/2016 0.1  0 - 0.1 K/uL Final  . Sodium 09/15/2016 140  135 - 145 mmol/L Final  . Potassium 09/15/2016 3.7  3.5 - 5.1 mmol/L Final  . Chloride 09/15/2016 106  101 - 111 mmol/L Final  . CO2 09/15/2016 28  22 - 32 mmol/L Final  . Glucose, Bld 09/15/2016 92  65 - 99 mg/dL Final  . BUN 09/15/2016 11  6 - 20 mg/dL Final  . Creatinine, Ser 09/15/2016 0.74  0.44 - 1.00 mg/dL Final  . Calcium 09/15/2016 9.1  8.9 - 10.3 mg/dL Final  . Total Protein 09/15/2016 6.6  6.5 - 8.1 g/dL Final  . Albumin 09/15/2016 3.7  3.5 - 5.0 g/dL Final  . AST 09/15/2016 18  15 - 41 U/L Final  . ALT 09/15/2016 12* 14 - 54 U/L Final  .  Alkaline Phosphatase 09/15/2016 48  38 - 126 U/L Final  . Total Bilirubin 09/15/2016 0.9  0.3 - 1.2 mg/dL Final  . GFR calc non Af Amer 09/15/2016 >60  >60 mL/min Final  . GFR calc Af Amer 09/15/2016 >60  >60 mL/min Final   Comment: (NOTE) The eGFR has been calculated using the CKD EPI equation. This calculation has not been validated in all clinical situations. eGFR's persistently <60 mL/min signify possible Chronic Kidney Disease.   Cheryl Diaz gap 09/15/2016 6  5 - 15 Final    Assessment:  Zamariah Seaborn is a 80 y.o. female with stage I right breast cancer s/p lumpectomy followed by partial mastectomy.  Lumpectomy on 10/06/2010 revealed a 1.3 cm grade II invasive ductal carcinoma. 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 There was no lymphovascular invasion. Tumor was ER + (> 90%) PR + (> 90%) and HER-2/neu - by FISH.    She underwent right partial mastectomy (for margins) and sentinel lymph node biopsy on 10/21/2010.  There was no evidence of residual in situ or invasive carcinoma. There were fibrocystic changes and ductal hyperplasia.  Lymph node biopsy revealed no macrometastasis.  Pathologic stage was T1cN0.  Mammogram on 09/04/2010 revealed a small spiculated mass with pleomorphic microcalcifications in the right breast.  Ultrasound confirmed a 6 mm hypoechoic mass heterogeneous shadowing o'clock position.  Bilateral diagnostic mammogram on 02/09/2016 revealed a stable right breast lumpectomy site and no evidence of malignancy.  Oncotype DX score was 15.  She was treated with the radiation.  He received a Arimidex from 02/12/2011 - 05/15/2011. Arimidex was discontinued secondary to poor tolerance.  She began tamoxifen on 05/15/2011.  BCI testing on 08/01/2015 revealed a 4.3% risk of late recurrence between years 5 and 10 (low risk category) with a high likelihood of benefit for extended adjuvant therapy.  Symptomatically,  she is doing well. She denies any breast concerns. Exam reveals post-operative changes.  Plan: 1.  Review entire medical history, diagnosis, staging, and management of breast cancer.  Discuss current hormonal therapy (tamoxifen).  Discuss BCI results with low risk of recurrence after 5 years of hormonal therapy, but high likelihood  of benefit. 2.  Information on BCI testing. 3.  Labs today:  CBC with diff, CMP. 4.  RTC in 6 months for MD assessment and labs (CBC with diff, CMP, CA27.29).   Lequita Asal, MD  09/15/2016, 2:35 PM

## 2016-10-12 ENCOUNTER — Other Ambulatory Visit: Payer: Self-pay | Admitting: Rheumatology

## 2016-10-12 DIAGNOSIS — G8929 Other chronic pain: Secondary | ICD-10-CM

## 2016-10-12 DIAGNOSIS — M545 Low back pain: Principal | ICD-10-CM

## 2016-10-13 ENCOUNTER — Telehealth: Payer: Self-pay | Admitting: *Deleted

## 2016-10-13 NOTE — Telephone Encounter (Signed)
Called pt and got her voicemail. I left message that our staff has checked her insurance and she does not require prior auth to have testing done but this is not approval that insurance will cover it or not. When she was in the office I did tell her that the test was expensive and she would not know the cost for sometimes up to a year.  However she can fill out a financial profile to see if she qualifies and then she will know the max amount of payment she will have to pay.  I asked her to call back and I will talk to her about this so I can explain everything and answer her questions and if she is ready to make decision to move forward with BCI testing or not.

## 2016-10-14 ENCOUNTER — Other Ambulatory Visit: Payer: Self-pay | Admitting: Rheumatology

## 2016-10-14 DIAGNOSIS — M25473 Effusion, unspecified ankle: Secondary | ICD-10-CM

## 2016-10-14 DIAGNOSIS — M545 Low back pain: Secondary | ICD-10-CM

## 2016-10-14 DIAGNOSIS — G8929 Other chronic pain: Secondary | ICD-10-CM

## 2016-10-14 DIAGNOSIS — G629 Polyneuropathy, unspecified: Secondary | ICD-10-CM

## 2016-10-20 ENCOUNTER — Ambulatory Visit
Admission: RE | Admit: 2016-10-20 | Discharge: 2016-10-20 | Disposition: A | Discharge status: Home / Self Care | Payer: Medicare Other | Source: Ambulatory Visit | Attending: Rheumatology | Admitting: Rheumatology

## 2016-10-20 DIAGNOSIS — M48061 Spinal stenosis, lumbar region without neurogenic claudication: Secondary | ICD-10-CM | POA: Diagnosis not present

## 2016-10-20 DIAGNOSIS — R937 Abnormal findings on diagnostic imaging of other parts of musculoskeletal system: Secondary | ICD-10-CM | POA: Diagnosis not present

## 2016-10-20 DIAGNOSIS — M5136 Other intervertebral disc degeneration, lumbar region: Secondary | ICD-10-CM | POA: Diagnosis not present

## 2016-10-20 DIAGNOSIS — M545 Low back pain: Secondary | ICD-10-CM | POA: Diagnosis present

## 2016-10-20 DIAGNOSIS — G8929 Other chronic pain: Secondary | ICD-10-CM

## 2016-10-20 DIAGNOSIS — G629 Polyneuropathy, unspecified: Secondary | ICD-10-CM | POA: Diagnosis present

## 2016-11-02 ENCOUNTER — Other Ambulatory Visit: Payer: Self-pay | Admitting: *Deleted

## 2016-11-02 MED ORDER — TAMOXIFEN CITRATE 20 MG PO TABS: ORAL_TABLET | ORAL | 2 refills | Status: DC

## 2016-11-28 ENCOUNTER — Encounter: Payer: Self-pay | Admitting: Hematology and Oncology

## 2017-02-08 ENCOUNTER — Other Ambulatory Visit: Payer: Self-pay | Admitting: Hematology and Oncology

## 2017-02-08 DIAGNOSIS — C50411 Malignant neoplasm of upper-outer quadrant of right female breast: Secondary | ICD-10-CM

## 2017-02-09 ENCOUNTER — Ambulatory Visit
Admission: RE | Admit: 2017-02-09 | Discharge: 2017-02-09 | Disposition: A | Discharge status: Home / Self Care | Payer: Medicare Other | Source: Ambulatory Visit | Attending: Family Medicine | Admitting: Family Medicine

## 2017-02-09 ENCOUNTER — Other Ambulatory Visit: Payer: Self-pay | Admitting: Hematology and Oncology

## 2017-02-09 ENCOUNTER — Ambulatory Visit
Admission: RE | Admit: 2017-02-09 | Discharge: 2017-02-09 | Disposition: A | Discharge status: Home / Self Care | Payer: Medicare Other | Source: Ambulatory Visit | Attending: Hematology and Oncology | Admitting: Hematology and Oncology

## 2017-02-09 DIAGNOSIS — Z170 Estrogen receptor positive status [ER+]: Principal | ICD-10-CM

## 2017-02-09 DIAGNOSIS — C50411 Malignant neoplasm of upper-outer quadrant of right female breast: Secondary | ICD-10-CM

## 2017-02-09 DIAGNOSIS — Z853 Personal history of malignant neoplasm of breast: Secondary | ICD-10-CM | POA: Diagnosis not present

## 2017-02-09 DIAGNOSIS — C50919 Malignant neoplasm of unspecified site of unspecified female breast: Secondary | ICD-10-CM

## 2017-02-09 DIAGNOSIS — Z08 Encounter for follow-up examination after completed treatment for malignant neoplasm: Secondary | ICD-10-CM | POA: Diagnosis not present

## 2017-02-09 HISTORY — DX: Personal history of irradiation: Z92.3

## 2017-03-16 ENCOUNTER — Encounter: Payer: Self-pay | Admitting: Hematology and Oncology

## 2017-03-16 ENCOUNTER — Inpatient Hospital Stay: Payer: Medicare Other | Attending: Hematology and Oncology

## 2017-03-16 ENCOUNTER — Inpatient Hospital Stay (HOSPITAL_BASED_OUTPATIENT_CLINIC_OR_DEPARTMENT_OTHER): Payer: Medicare Other | Admitting: Hematology and Oncology

## 2017-03-16 VITALS — BP 156/78 | HR 72 | Temp 97.8°F | Resp 18 | Wt 147.3 lb

## 2017-03-16 DIAGNOSIS — Z79810 Long term (current) use of selective estrogen receptor modulators (SERMs): Secondary | ICD-10-CM | POA: Insufficient documentation

## 2017-03-16 DIAGNOSIS — Z170 Estrogen receptor positive status [ER+]: Secondary | ICD-10-CM | POA: Diagnosis not present

## 2017-03-16 DIAGNOSIS — C50911 Malignant neoplasm of unspecified site of right female breast: Secondary | ICD-10-CM | POA: Insufficient documentation

## 2017-03-16 DIAGNOSIS — C50919 Malignant neoplasm of unspecified site of unspecified female breast: Secondary | ICD-10-CM

## 2017-03-16 DIAGNOSIS — K219 Gastro-esophageal reflux disease without esophagitis: Secondary | ICD-10-CM | POA: Insufficient documentation

## 2017-03-16 DIAGNOSIS — Z87891 Personal history of nicotine dependence: Secondary | ICD-10-CM | POA: Diagnosis not present

## 2017-03-16 LAB — CBC WITH DIFFERENTIAL/PLATELET
Basophils Absolute: 0 10*3/uL (ref 0–0.1)
Basophils Relative: 1 %
Eosinophils Absolute: 0.1 10*3/uL (ref 0–0.7)
Eosinophils Relative: 1 %
HCT: 38.6 % (ref 35.0–47.0)
Hemoglobin: 13.1 g/dL (ref 12.0–16.0)
Lymphocytes Relative: 30 %
Lymphs Abs: 1.9 10*3/uL (ref 1.0–3.6)
MCH: 32.9 pg (ref 26.0–34.0)
MCHC: 34 g/dL (ref 32.0–36.0)
MCV: 96.9 fL (ref 80.0–100.0)
Monocytes Absolute: 0.5 10*3/uL (ref 0.2–0.9)
Monocytes Relative: 9 %
Neutro Abs: 3.7 10*3/uL (ref 1.4–6.5)
Neutrophils Relative %: 59 %
Platelets: 176 10*3/uL (ref 150–440)
RBC: 3.98 MIL/uL (ref 3.80–5.20)
RDW: 13.4 % (ref 11.5–14.5)
WBC: 6.1 10*3/uL (ref 3.6–11.0)

## 2017-03-16 LAB — COMPREHENSIVE METABOLIC PANEL
ALT: 13 U/L — ABNORMAL LOW (ref 14–54)
AST: 21 U/L (ref 15–41)
Albumin: 4 g/dL (ref 3.5–5.0)
Alkaline Phosphatase: 56 U/L (ref 38–126)
Anion gap: 8 (ref 5–15)
BUN: 12 mg/dL (ref 6–20)
CO2: 25 mmol/L (ref 22–32)
Calcium: 9 mg/dL (ref 8.9–10.3)
Chloride: 106 mmol/L (ref 101–111)
Creatinine, Ser: 0.79 mg/dL (ref 0.44–1.00)
GFR calc Af Amer: >60 mL/min (ref >60)
GFR calc non Af Amer: >60 mL/min (ref >60)
Glucose, Bld: 101 mg/dL — ABNORMAL HIGH (ref 65–99)
Potassium: 3.6 mmol/L (ref 3.5–5.1)
Sodium: 139 mmol/L (ref 135–145)
Total Bilirubin: 0.7 mg/dL (ref 0.3–1.2)
Total Protein: 6.6 g/dL (ref 6.5–8.1)

## 2017-03-16 NOTE — Progress Notes (Signed)
Patient offers no complaints.today.  Patient had eye surgery the middle of May.  She is now having floaters in her line of vision.  She had a recheck this past Friday.

## 2017-03-16 NOTE — Progress Notes (Addendum)
1 

## 2017-03-16 NOTE — Progress Notes (Signed)
[facility] day:  03/16/2017  Chief Complaint: Cheryl Diaz is a 81 y.o. female with stage I right breast cancer who is seen for 6 month assessment.  HPI:  The patient was last seen in the medical oncology clinic on 09/15/2016.  At that time, she was seen for initial assessment.  She was doing well. She denied any breast concerns. Exam revealed post-operative changes.  She had started Arimidex in 02/2011 and had switched to tamoxifen in 05/2011. We discussed her BCI results with low risk of recurrence after 5 years of hormonal therapy, but high likelihood of benefit.   Bilateral diagnostic mammogram on 02/09/2017 revealed no mammographic evidence of malignancy.  Right axillary ultrasound revealed no suspicious cystic or solid sonographic findings.  During the interim, She has felt "ok".  She notes floaters in her eyes.  She denies any breast concerns.  She notes a sore spot under her right arm.   Past Medical History:  Diagnosis Date  . Anemia   . Arthritis   . Breast cancer ([place]) 10/2010   ER/PR+, Her2neu- RT LUMPECTOMY  . GERD (gastroesophageal reflux disease)   . Personal history of radiation therapy   . Radiation 2011   RIGHT BREAST CA    Past Surgical History:  Procedure Laterality Date  . BREAST BIOPSY Right 2012   +  . CARPAL TUNNEL RELEASE      Family History  Problem Relation Age of Onset  . Breast cancer Neg Hx     Social History:  reports that she has quit smoking. She has never used smokeless tobacco. Her alcohol and drug histories are not on file.  She lives in [place].  Her husband is handicapped and is almost totally blind.  The patient lives in [place].  The patient is alone today.  Allergies:  Allergies  Allergen Reactions  . Diclofenac Other (See Comments)  . Penicillin G Other (See Comments)    lumps and swelling, hives  . Cephalosporins Rash    Current Medications: Current Outpatient Prescriptions   Medication Sig Dispense Refill  . acetaminophen (TYLENOL) 325 MG tablet Take 650 mg by mouth every 6 (six) hours as needed.    . Calcium Carb-Cholecalciferol (CALCIUM 600 + D PO) Take 1 tablet by mouth daily.    . cyanocobalamin (,VITAMIN B-12,) 1000 MCG/ML injection Inject 1,000 mcg into the muscle every 30 (thirty) days.    Marland Kitchen loratadine (CLARITIN) 10 MG tablet Take 10 mg by mouth daily as needed for allergies.    . tamoxifen (NOLVADEX) 20 MG tablet TAKE 1 TABLET AT BEDTIME. DO NOT TAKE IF YOU    ARE PREGNANT. DO NOT SKIP OR STOP A DOSE. TAKE  EXACLTY AS PRESCRIBED 270 tablet 2  . Fluticasone-Salmeterol (ADVAIR DISKUS) 250-50 MCG/DOSE AEPB Inhale into the lungs every 12 (twelve) hours as needed.    . naproxen sodium (RA NAPROXEN SODIUM) 220 MG tablet Take by mouth.     No current facility-administered medications for this visit.     Review of Systems:  GENERAL:  Feels"ok".  No fevers or sweats.  Weight down 4 pounds. PERFORMANCE STATUS (ECOG):  1 HEENT:  No visual changes, runny nose, sore throat, mouth sores or tenderness. Lungs: No shortness of breath.  Cough (see HPI).  No hemoptysis. Cardiac:  No chest pain, palpitations, orthopnea, or PND. GI:  No nausea, vomiting, diarrhea, constipation, melena or hematochezia. GU:  No urgency, frequency, dysuria, or hematuria. Musculoskeletal:  Scoliosis.  No back pain.  Arthritis in thumbs (right > left).  No muscle tenderness. Extremities:  No pain or swelling. Skin:  Sore spot under right arm.  No rashes or skin changes. Neuro:  Poor memory.  No headache, numbness or weakness, balance or coordination issues. Endocrine:  No diabetes, thyroid issues, hot flashes or night sweats. Psych:  No mood changes, depression or anxiety. Pain:  No focal pain. Review of systems:  All other systems reviewed and found to be negative.  Physical Exam: Blood pressure (!) 156/78, pulse 72, temperature 97.8 F (36.6 C), temperature source Tympanic, resp. rate  18, weight 147 lb 4.3 oz (66.8 kg). GENERAL:  Well developed, well nourished, woman sitting comfortably in the exam room in no acute distress. MENTAL STATUS:  Alert and oriented to person, place and time. HEAD: Blonde hair.  Normocephalic, atraumatic, face symmetric, no Cushingoid features. EYES:  Glasses.  Blue eyes.  Pupils equal round and reactive to light and accomodation.  No conjunctivitis or scleral icterus. ENT:  Oropharynx clear without lesion.  Tongue normal.  Upper plate.  Mucous membranes moist.  RESPIRATORY:  Clear to auscultation without rales, wheezes or rhonchi. CARDIOVASCULAR:  Regular rate and rhythm without murmur, rub or gallop. BREAST:  Right breast with post-operative and post-radiation changes.  Edema inferiorly (stable).  Semi-circular incision.  Inverted nipple.  No discrete masses, skin changes or nipple discharge.  Left breast with fibrocystic changes in the outer quadrants.  No masses, skin changes or nipple discharge.  ABDOMEN:  Soft, non-tender, with active bowel sounds, and no hepatosplenomegaly.  No masses. SKIN:  No tender spot identified.  No rashes, ulcers or lesions. EXTREMITIES: No edema, no skin discoloration or tenderness.  No palpable cords. LYMPH NODES: No palpable cervical, supraclavicular, axillary or inguinal adenopathy  NEUROLOGICAL: Unremarkable. PSYCH:  Appropriate.   Appointment on 03/16/2017  Component Date Value Ref Range Status  . WBC 03/16/2017 6.1  3.6 - 11.0 K/uL Final  . RBC 03/16/2017 3.98  3.80 - 5.20 MIL/uL Final  . Hemoglobin 03/16/2017 13.1  12.0 - 16.0 g/dL Final  . HCT 03/16/2017 38.6  35.0 - 47.0 % Final  . MCV 03/16/2017 96.9  80.0 - 100.0 fL Final  . MCH 03/16/2017 32.9  26.0 - 34.0 pg Final  . MCHC 03/16/2017 34.0  32.0 - 36.0 g/dL Final  . RDW 03/16/2017 13.4  11.5 - 14.5 % Final  . Platelets 03/16/2017 176  150 - 440 K/uL Final  . Neutrophils Relative % 03/16/2017 59  % Final  . Neutro Abs 03/16/2017 3.7  1.4 - 6.5 K/uL  Final  . Lymphocytes Relative 03/16/2017 30  % Final  . Lymphs Abs 03/16/2017 1.9  1.0 - 3.6 K/uL Final  . Monocytes Relative 03/16/2017 9  % Final  . Monocytes Absolute 03/16/2017 0.5  0.2 - 0.9 K/uL Final  . Eosinophils Relative 03/16/2017 1  % Final  . Eosinophils Absolute 03/16/2017 0.1  0 - 0.7 K/uL Final  . Basophils Relative 03/16/2017 1  % Final  . Basophils Absolute 03/16/2017 0.0  0 - 0.1 K/uL Final  . Sodium 03/16/2017 139  135 - 145 mmol/L Final  . Potassium 03/16/2017 3.6  3.5 - 5.1 mmol/L Final  . Chloride 03/16/2017 106  101 - 111 mmol/L Final  . CO2 03/16/2017 25  22 - 32 mmol/L Final  . Glucose, Bld 03/16/2017 101* 65 - 99 mg/dL Final  . BUN 03/16/2017 12  6 - 20 mg/dL Final  . Creatinine, Ser  03/16/2017 0.79  0.44 - 1.00 mg/dL Final  . Calcium 03/16/2017 9.0  8.9 - 10.3 mg/dL Final  . Total Protein 03/16/2017 6.6  6.5 - 8.1 g/dL Final  . Albumin 03/16/2017 4.0  3.5 - 5.0 g/dL Final  . AST 03/16/2017 21  15 - 41 U/L Final  . ALT 03/16/2017 13* 14 - 54 U/L Final  . Alkaline Phosphatase 03/16/2017 56  38 - 126 U/L Final  . Total Bilirubin 03/16/2017 0.7  0.3 - 1.2 mg/dL Final  . GFR calc non Af Amer 03/16/2017 >60  >60 mL/min Final  . GFR calc Af Amer 03/16/2017 >60  >60 mL/min Final   Comment: (NOTE) The eGFR has been calculated using the CKD EPI equation. This calculation has not been validated in all clinical situations. eGFR's persistently <60 mL/min signify possible Chronic Kidney Disease.   Georgiann Hahn gap 03/16/2017 8  5 - 15 Final    Assessment:  Marzella Miracle is a 81 y.o. female with stage I right breast cancer s/p lumpectomy followed by partial mastectomy.  Lumpectomy on 10/06/2010 revealed a 1.3 cm grade II invasive ductal carcinoma. 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 There was no lymphovascular invasion. Tumor was ER + (> 90%) PR + (> 90%) and HER-2/neu - by FISH.    She underwent  right partial mastectomy (for margins) and sentinel lymph node biopsy on 10/21/2010.  There was no evidence of residual in situ or invasive carcinoma. There were fibrocystic changes and ductal hyperplasia.  Lymph node biopsy revealed no macrometastasis.  Pathologic stage was T1cN0.  Mammogram on 09/04/2010 revealed a small spiculated mass with pleomorphic microcalcifications in the right breast.  Ultrasound confirmed a 6 mm hypoechoic mass heterogeneous shadowing o'clock position.  Bilateral diagnostic mammogram on 02/09/2017 revealed no evidence of malignancy.  Oncotype DX score was 15.  She was treated with the radiation.  She received a Arimidex from 02/12/2011 - 05/15/2011. Arimidex was discontinued secondary to poor tolerance.  She began tamoxifen on 05/15/2011.  BCI testing on 08/01/2015 revealed a 4.3% risk of late recurrence between years 5 and 10 (low risk category) with a high likelihood of benefit for extended adjuvant therapy.  CA27.29 has been followed: 38.7 on 02/24/2011, 27.4 on 09/10/2011, 32.8 on 03/10/2012 and 30.8 on 07/24/2015.  Symptomatically, she is doing well. She denies any breast concerns. Exam reveals post-operative changes.  Plan: 1.  Labs today:  CBC with diff, CMP, CA27.29. 2.  Review interval mammogram and ultrasound.  No evidence of recurrent disease. 3.  RTC in 6 months for MD assessment and labs (CBC with diff, CMP, CA27.29).   Lequita Asal, MD  03/16/2017, 3:10 PM

## 2017-03-17 LAB — CANCER ANTIGEN 27.29: CA 27.29: 31.6 U/mL (ref 0.0–38.6)

## 2017-09-13 NOTE — Progress Notes (Deleted)
[facility] day:  09/13/2017  Chief Complaint: Cheryl Diaz is a 81 y.o. female with stage I right breast cancer who is seen for 6 month assessment on tamoxifen.   HPI:  The patient was last seen in the medical oncology clinic on 03/16/2017.  At that time, patient felt "okay". She complained of ocular floaters and a "sore spot' under her right arm. She denied any breast concerns. CBC and CMP were normal. CA 27.29 was 31.6.  She continued on hormonal therapy (tamoxifen).   During the interim,   Past Medical History:  Diagnosis Date  . Anemia   . Arthritis   . Breast cancer ([place]) 10/2010   ER/PR+, Her2neu- RT LUMPECTOMY  . GERD (gastroesophageal reflux disease)   . Personal history of radiation therapy   . Radiation 2011   RIGHT BREAST CA    Past Surgical History:  Procedure Laterality Date  . BREAST BIOPSY Right 2012   +  . CARPAL TUNNEL RELEASE      Family History  Problem Relation Age of Onset  . Breast cancer Neg Hx     Social History:  reports that she has quit smoking. she has never used smokeless tobacco. Her alcohol and drug histories are not on file.  She lives in [place].  Her husband is handicapped and is almost totally blind.  The patient lives in [place].  The patient is alone today.  Allergies:  Allergies  Allergen Reactions  . Diclofenac Other (See Comments)  . Penicillin G Other (See Comments)    lumps and swelling, hives  . Cephalosporins Rash    Current Medications: Current Outpatient Medications  Medication Sig Dispense Refill  . acetaminophen (TYLENOL) 325 MG tablet Take 650 mg by mouth every 6 (six) hours as needed.    . Calcium Carb-Cholecalciferol (CALCIUM 600 + D PO) Take 1 tablet by mouth daily.    . cyanocobalamin (,VITAMIN B-12,) 1000 MCG/ML injection Inject 1,000 mcg into the muscle every 30 (thirty) days.    . Fluticasone-Salmeterol (ADVAIR DISKUS) 250-50 MCG/DOSE AEPB Inhale into the lungs  every 12 (twelve) hours as needed.    . loratadine (CLARITIN) 10 MG tablet Take 10 mg by mouth daily as needed for allergies.    . naproxen sodium (RA NAPROXEN SODIUM) 220 MG tablet Take by mouth.    . tamoxifen (NOLVADEX) 20 MG tablet TAKE 1 TABLET AT BEDTIME. DO NOT TAKE IF YOU    ARE PREGNANT. DO NOT SKIP OR STOP A DOSE. TAKE  EXACLTY AS PRESCRIBED 270 tablet 2   No current facility-administered medications for this visit.     Review of Systems:  GENERAL:  Feels"ok".  No fevers or sweats.  Weight down 4 pounds. PERFORMANCE STATUS (ECOG):  1 HEENT:  No visual changes, runny nose, sore throat, mouth sores or tenderness. Lungs: No shortness of breath.  Cough (see HPI).  No hemoptysis. Cardiac:  No chest pain, palpitations, orthopnea, or PND. GI:  No nausea, vomiting, diarrhea, constipation, melena or hematochezia. GU:  No urgency, frequency, dysuria, or hematuria. Musculoskeletal:  Scoliosis.  No back pain.  Arthritis in thumbs (right > left).  No muscle tenderness. Extremities:  No pain or swelling. Skin:  Sore spot under right arm.  No rashes or skin changes. Neuro:  Poor memory.  No headache, numbness or weakness, balance or coordination issues. Endocrine:  No diabetes, thyroid issues, hot flashes or night sweats. Psych:  No mood changes, depression or anxiety.  Pain:  No focal pain. Review of systems:  All other systems reviewed and found to be negative.  Physical Exam: There were no vitals taken for this visit. GENERAL:  Well developed, well nourished, woman sitting comfortably in the exam room in no acute distress. MENTAL STATUS:  Alert and oriented to person, place and time. HEAD: Blonde hair.  Normocephalic, atraumatic, face symmetric, no Cushingoid features. EYES:  Glasses.  Blue eyes.  Pupils equal round and reactive to light and accomodation.  No conjunctivitis or scleral icterus. ENT:  Oropharynx clear without lesion.  Tongue normal.  Upper plate.  Mucous membranes moist.   RESPIRATORY:  Clear to auscultation without rales, wheezes or rhonchi. CARDIOVASCULAR:  Regular rate and rhythm without murmur, rub or gallop. BREAST:  Right breast with post-operative and post-radiation changes.  Edema inferiorly (stable).  Semi-circular incision.  Inverted nipple.  No discrete masses, skin changes or nipple discharge.  Left breast with fibrocystic changes in the outer quadrants.  No masses, skin changes or nipple discharge.  ABDOMEN:  Soft, non-tender, with active bowel sounds, and no hepatosplenomegaly.  No masses. SKIN:  No tender spot identified.  No rashes, ulcers or lesions. EXTREMITIES: No edema, no skin discoloration or tenderness.  No palpable cords. LYMPH NODES: No palpable cervical, supraclavicular, axillary or inguinal adenopathy  NEUROLOGICAL: Unremarkable. PSYCH:  Appropriate.   No visits with results within 3 Day(s) from this visit.  Latest known visit with results is:  Appointment on 03/16/2017  Component Date Value Ref Range Status  . WBC 03/16/2017 6.1  3.6 - 11.0 K/uL Final  . RBC 03/16/2017 3.98  3.80 - 5.20 MIL/uL Final  . Hemoglobin 03/16/2017 13.1  12.0 - 16.0 g/dL Final  . HCT 03/16/2017 38.6  35.0 - 47.0 % Final  . MCV 03/16/2017 96.9  80.0 - 100.0 fL Final  . MCH 03/16/2017 32.9  26.0 - 34.0 pg Final  . MCHC 03/16/2017 34.0  32.0 - 36.0 g/dL Final  . RDW 03/16/2017 13.4  11.5 - 14.5 % Final  . Platelets 03/16/2017 176  150 - 440 K/uL Final  . Neutrophils Relative % 03/16/2017 59  % Final  . Neutro Abs 03/16/2017 3.7  1.4 - 6.5 K/uL Final  . Lymphocytes Relative 03/16/2017 30  % Final  . Lymphs Abs 03/16/2017 1.9  1.0 - 3.6 K/uL Final  . Monocytes Relative 03/16/2017 9  % Final  . Monocytes Absolute 03/16/2017 0.5  0.2 - 0.9 K/uL Final  . Eosinophils Relative 03/16/2017 1  % Final  . Eosinophils Absolute 03/16/2017 0.1  0 - 0.7 K/uL Final  . Basophils Relative 03/16/2017 1  % Final  . Basophils Absolute 03/16/2017 0.0  0 - 0.1 K/uL Final   . Sodium 03/16/2017 139  135 - 145 mmol/L Final  . Potassium 03/16/2017 3.6  3.5 - 5.1 mmol/L Final  . Chloride 03/16/2017 106  101 - 111 mmol/L Final  . CO2 03/16/2017 25  22 - 32 mmol/L Final  . Glucose, Bld 03/16/2017 101* 65 - 99 mg/dL Final  . BUN 03/16/2017 12  6 - 20 mg/dL Final  . Creatinine, Ser 03/16/2017 0.79  0.44 - 1.00 mg/dL Final  . Calcium 03/16/2017 9.0  8.9 - 10.3 mg/dL Final  . Total Protein 03/16/2017 6.6  6.5 - 8.1 g/dL Final  . Albumin 03/16/2017 4.0  3.5 - 5.0 g/dL Final  . AST 03/16/2017 21  15 - 41 U/L Final  . ALT 03/16/2017 13* 14 - 54 U/L Final  .  Alkaline Phosphatase 03/16/2017 56  38 - 126 U/L Final  . Total Bilirubin 03/16/2017 0.7  0.3 - 1.2 mg/dL Final  . GFR calc non Af Amer 03/16/2017 >60  >60 mL/min Final  . GFR calc Af Amer 03/16/2017 >60  >60 mL/min Final   Comment: (NOTE) The eGFR has been calculated using the CKD EPI equation. This calculation has not been validated in all clinical situations. eGFR's persistently <60 mL/min signify possible Chronic Kidney Disease.   . Anion gap 03/16/2017 8  5 - 15 Final  . CA 27.29 03/16/2017 31.6  0.0 - 38.6 U/mL Final   Comment: (NOTE) [employer]/ACS methodology Performed At: [facility] [address] [place], [place] [id number] Lindon Romp MD WU:[id number]     Assessment:  Cheryl Diaz is a 81 y.o. female with stage I right breast cancer s/p lumpectomy followed by partial mastectomy.  Lumpectomy on 10/06/2010 revealed a 1.3 cm grade II invasive ductal carcinoma. 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 There was no lymphovascular invasion. Tumor was ER + (> 90%) PR + (> 90%) and HER-2/neu - by FISH.    She underwent right partial mastectomy (for margins) and sentinel lymph node biopsy on 10/21/2010.  There was no evidence of residual in situ or invasive carcinoma. There were fibrocystic changes and ductal hyperplasia.   Lymph node biopsy revealed no macrometastasis.  Pathologic stage was T1cN0.  Bilateral diagnostic mammogram on 02/09/2017 revealed no evidence of malignancy.  Oncotype DX score was 15.  She was treated with the radiation.  She received a Arimidex from 02/12/2011 - 05/15/2011. Arimidex was discontinued secondary to poor tolerance.  She began tamoxifen on 05/15/2011.  BCI testing on 08/01/2015 revealed a 4.3% risk of late recurrence between years 5 and 10 (low risk category) with a high likelihood of benefit for extended adjuvant therapy.  CA27.29 has been followed: 38.7 on 02/24/2011, 27.4 on 09/10/2011, 32.8 on 03/10/2012, 30.8 on 07/24/2015, and 31.6 on 03/16/2017.  Symptomatically,  she is doing well. She denies any breast concerns. Exam reveals post-operative changes.  Plan: 1.  Labs today:  CBC with diff, CMP, CA27.29. 2.  Schedule annual mammogram on 02/09/2018. 3.  Continue tamoxifen.  4.  RTC in 6 months for MD assessment and labs (CBC with diff, CMP, CA27.29).   Honor Loh, NP  09/13/2017, 6:58 PM   I saw and evaluated the patient, participating in the key portions of the service and reviewing pertinent diagnostic studies and records.  I reviewed the nurse practitioner's note and agree with the findings and the plan.  The assessment and plan were discussed with the patient.  Additional diagnostic studies of *** are needed to clarify *** and would change the clinical management.  A few ***multiple questions were asked by the patient and answered.   Nolon Stalls, MD 09/14/2017,5:03 AM

## 2017-09-14 ENCOUNTER — Inpatient Hospital Stay: Payer: Medicare Other

## 2017-09-14 ENCOUNTER — Inpatient Hospital Stay: Payer: Medicare Other | Attending: Hematology and Oncology | Admitting: Hematology and Oncology

## 2017-10-12 ENCOUNTER — Inpatient Hospital Stay: Payer: Medicare Other | Attending: Hematology and Oncology | Admitting: Hematology and Oncology

## 2017-10-12 ENCOUNTER — Inpatient Hospital Stay: Payer: Medicare Other

## 2017-10-12 VITALS — BP 125/80 | HR 73 | Temp 96.5°F | Resp 18 | Wt 147.9 lb

## 2017-10-12 DIAGNOSIS — Z79810 Long term (current) use of selective estrogen receptor modulators (SERMs): Secondary | ICD-10-CM | POA: Diagnosis not present

## 2017-10-12 DIAGNOSIS — C50911 Malignant neoplasm of unspecified site of right female breast: Secondary | ICD-10-CM | POA: Diagnosis not present

## 2017-10-12 DIAGNOSIS — C50919 Malignant neoplasm of unspecified site of unspecified female breast: Secondary | ICD-10-CM

## 2017-10-12 DIAGNOSIS — Z923 Personal history of irradiation: Secondary | ICD-10-CM | POA: Insufficient documentation

## 2017-10-12 DIAGNOSIS — Z170 Estrogen receptor positive status [ER+]: Principal | ICD-10-CM

## 2017-10-12 LAB — COMPREHENSIVE METABOLIC PANEL
ALT: 11 U/L — ABNORMAL LOW (ref 14–54)
AST: 21 U/L (ref 15–41)
Albumin: 3.9 g/dL (ref 3.5–5.0)
Alkaline Phosphatase: 62 U/L (ref 38–126)
Anion gap: 7 (ref 5–15)
BUN: 15 mg/dL (ref 6–20)
CO2: 25 mmol/L (ref 22–32)
Calcium: 8.8 mg/dL — ABNORMAL LOW (ref 8.9–10.3)
Chloride: 110 mmol/L (ref 101–111)
Creatinine, Ser: 0.78 mg/dL (ref 0.44–1.00)
GFR calc Af Amer: >60 mL/min (ref >60)
GFR calc non Af Amer: >60 mL/min (ref >60)
Glucose, Bld: 126 mg/dL — ABNORMAL HIGH (ref 65–99)
Potassium: 3.5 mmol/L (ref 3.5–5.1)
Sodium: 142 mmol/L (ref 135–145)
Total Bilirubin: 0.7 mg/dL (ref 0.3–1.2)
Total Protein: 6.8 g/dL (ref 6.5–8.1)

## 2017-10-12 LAB — CBC WITH DIFFERENTIAL/PLATELET
Basophils Absolute: 0.1 10*3/uL (ref 0–0.1)
Basophils Relative: 1 %
Eosinophils Absolute: 0.1 10*3/uL (ref 0–0.7)
Eosinophils Relative: 2 %
HCT: 37.6 % (ref 35.0–47.0)
Hemoglobin: 12.9 g/dL (ref 12.0–16.0)
Lymphocytes Relative: 28 %
Lymphs Abs: 1.6 10*3/uL (ref 1.0–3.6)
MCH: 33.3 pg (ref 26.0–34.0)
MCHC: 34.4 g/dL (ref 32.0–36.0)
MCV: 96.8 fL (ref 80.0–100.0)
Monocytes Absolute: 0.5 10*3/uL (ref 0.2–0.9)
Monocytes Relative: 8 %
Neutro Abs: 3.7 10*3/uL (ref 1.4–6.5)
Neutrophils Relative %: 61 %
Platelets: 171 10*3/uL (ref 150–440)
RBC: 3.88 MIL/uL (ref 3.80–5.20)
RDW: 13 % (ref 11.5–14.5)
WBC: 5.9 10*3/uL (ref 3.6–11.0)

## 2017-10-12 NOTE — Progress Notes (Signed)
[facility] day:  10/12/2017  Chief Complaint: Cheryl Diaz is a 82 y.o. female with stage I right breast cancer who is seen for 6 month assessment on tamoxifen.   HPI:  The patient was last seen in the medical oncology clinic on 03/16/2017.  At that time, patient felt "okay". She complained of ocular floaters and a "sore spot' under her right arm. She denied any breast concerns. CBC and CMP were normal. CA 27.29 was 31.6.  She continued on hormonal therapy (tamoxifen).   During the interim, patient has been dealing with bronchitis for the last month. She has been seeing her PCP.   She denies fever. She notes that she has not been eating well, however her weight remains stable.  She continues to note RIGHT axillary "soreness".  She has not appreciated any adenopathy.  She denies B symptoms.   She does not perform monthly self breast examinations.    Past Medical History:  Diagnosis Date  . Anemia   . Arthritis   . Breast cancer ([place]) 10/2010   ER/PR+, Her2neu- RT LUMPECTOMY  . GERD (gastroesophageal reflux disease)   . Personal history of radiation therapy   . Radiation 2011   RIGHT BREAST CA    Past Surgical History:  Procedure Laterality Date  . BREAST BIOPSY Right 2012   +  . CARPAL TUNNEL RELEASE      Family History  Problem Relation Age of Onset  . Breast cancer Neg Hx     Social History:  reports that she has quit smoking. she has never used smokeless tobacco. Her alcohol and drug histories are not on file.  She lives in [place].  Her husband is handicapped and is almost totally blind.  The patient lives in [place].  The patient is alone today.  Allergies:  Allergies  Allergen Reactions  . Diclofenac Other (See Comments)  . Penicillin G Other (See Comments)    lumps and swelling, hives  . Cephalosporins Rash    Current Medications: Current Outpatient Medications  Medication Sig Dispense Refill  . acetaminophen  (TYLENOL) 325 MG tablet Take 650 mg by mouth every 6 (six) hours as needed.    . Calcium Carb-Cholecalciferol (CALCIUM 600 + D PO) Take 1 tablet by mouth daily.    . cyanocobalamin (,VITAMIN B-12,) 1000 MCG/ML injection Inject 1,000 mcg into the muscle every 30 (thirty) days.    Marland Kitchen loratadine (CLARITIN) 10 MG tablet Take 10 mg by mouth daily as needed for allergies.    . naproxen sodium (RA NAPROXEN SODIUM) 220 MG tablet Take by mouth.    . tamoxifen (NOLVADEX) 20 MG tablet TAKE 1 TABLET AT BEDTIME. DO NOT TAKE IF YOU    ARE PREGNANT. DO NOT SKIP OR STOP A DOSE. TAKE  EXACLTY AS PRESCRIBED 270 tablet 2  . Fluticasone-Salmeterol (ADVAIR DISKUS) 250-50 MCG/DOSE AEPB Inhale into the lungs every 12 (twelve) hours as needed.     No current facility-administered medications for this visit.     Review of Systems:  GENERAL:  Feels"ok".  No fevers or sweats.  Weight stable. PERFORMANCE STATUS (ECOG):  1 HEENT:  No visual changes, runny nose, sore throat, mouth sores or tenderness. Lungs: No shortness of breath.  Interval bronchitis.  Cough (see HPI).  No hemoptysis. Cardiac:  No chest pain, palpitations, orthopnea, or PND. GI: Not eating well.   No nausea, vomiting, diarrhea, constipation, melena or hematochezia. GU:  No urgency, frequency, dysuria, or  hematuria. Musculoskeletal:  Scoliosis.  No back pain.  Arthritis in thumbs (right > left).  No muscle tenderness. Extremities:  No pain or swelling. Skin:  Sore under right arm.  No rashes or skin changes. Neuro:  Poor memory.  No headache, numbness or weakness, balance or coordination issues. Endocrine:  No diabetes, thyroid issues, hot flashes or night sweats. Psych:  No mood changes, depression or anxiety. Pain:  No focal pain. Review of systems:  All other systems reviewed and found to be negative.  Physical Exam: Blood pressure 125/80, pulse 73, temperature (!) 96.5 F (35.8 C), temperature source Tympanic, resp. rate 18, weight 147 lb 14.9  oz (67.1 kg). GENERAL:  Well developed, well nourished, woman sitting comfortably in the exam room in no acute distress. MENTAL STATUS:  Alert and oriented to person, place and time. HEAD: Pearline Cables styled hair.  Normocephalic, atraumatic, face symmetric, no Cushingoid features. EYES:  Glasses.  Blue eyes.  Pupils equal round and reactive to light and accomodation.  No conjunctivitis or scleral icterus. ENT:  Oropharynx clear without lesion.  Tongue normal.  Upper plate.  Mucous membranes moist.  RESPIRATORY:  Clear to auscultation without rales, wheezes or rhonchi. CARDIOVASCULAR:  Regular rate and rhythm without murmur, rub or gallop. BREAST:  Right breast with post-operative and post-radiation changes between 11 and 2 o'clock (semi-circular incision).  Edema inferiorly (stable).  Inverted nipple.  No discrete masses, skin changes or nipple discharge.  Left breast with fibrocystic changes in the outer quadrants.  No masses, skin changes or nipple discharge.  ABDOMEN:  Soft, non-tender, with active bowel sounds, and no hepatosplenomegaly.  No masses. SKIN:  No rashes, ulcers or lesions. EXTREMITIES: No edema, no skin discoloration or tenderness.  No palpable cords. LYMPH NODES: No palpable cervical, supraclavicular, axillary or inguinal adenopathy  NEUROLOGICAL: Unremarkable. PSYCH:  Appropriate.   Appointment on 10/12/2017  Component Date Value Ref Range Status  . Sodium 10/12/2017 142  135 - 145 mmol/L Final  . Potassium 10/12/2017 3.5  3.5 - 5.1 mmol/L Final  . Chloride 10/12/2017 110  101 - 111 mmol/L Final  . CO2 10/12/2017 25  22 - 32 mmol/L Final  . Glucose, Bld 10/12/2017 126* 65 - 99 mg/dL Final  . BUN 10/12/2017 15  6 - 20 mg/dL Final  . Creatinine, Ser 10/12/2017 0.78  0.44 - 1.00 mg/dL Final  . Calcium 10/12/2017 8.8* 8.9 - 10.3 mg/dL Final  . Total Protein 10/12/2017 6.8  6.5 - 8.1 g/dL Final  . Albumin 10/12/2017 3.9  3.5 - 5.0 g/dL Final  . AST 10/12/2017 21  15 - 41 U/L Final   . ALT 10/12/2017 11* 14 - 54 U/L Final  . Alkaline Phosphatase 10/12/2017 62  38 - 126 U/L Final  . Total Bilirubin 10/12/2017 0.7  0.3 - 1.2 mg/dL Final  . GFR calc non Af Amer 10/12/2017 >60  >60 mL/min Final  . GFR calc Af Amer 10/12/2017 >60  >60 mL/min Final   Comment: (NOTE) The eGFR has been calculated using the CKD EPI equation. This calculation has not been validated in all clinical situations. eGFR's persistently <60 mL/min signify possible Chronic Kidney Disease.   Cheryl Diaz gap 10/12/2017 7  5 - 15 Final   Performed at [facility], [address] [place], [place] [address]    Assessment:  Aloha Bartok is a 82 y.o. female with stage I right breast cancer s/p lumpectomy followed by partial mastectomy.  Lumpectomy on 10/06/2010 revealed  a 1.3 cm grade II invasive ductal carcinoma. 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 There was no lymphovascular invasion. Tumor was ER + (> 90%) PR + (> 90%) and HER-2/neu - by FISH.    She underwent right partial mastectomy (for margins) and sentinel lymph node biopsy on 10/21/2010.  There was no evidence of residual in situ or invasive carcinoma. There were fibrocystic changes and ductal hyperplasia.  Lymph node biopsy revealed no macrometastasis.  Pathologic stage was T1cN0.  Bilateral diagnostic mammogram on 02/09/2017 revealed no evidence of malignancy.  Oncotype DX score was 15.  She was treated with the radiation.  She received a Arimidex from 02/12/2011 - 05/15/2011. Arimidex was discontinued secondary to poor tolerance.  She began Tamoxifen on 05/15/2011.  BCI testing on 08/01/2015 revealed a 4.3% risk of late recurrence between years 5 and 10 (low risk category) with a high likelihood of benefit for extended adjuvant therapy.  CA27.29 has been followed: 38.7 on 02/24/2011, 27.4 on 09/10/2011, 32.8 on 03/10/2012, 30.8 on 07/24/2015, 31.6 on 03/16/2017, and  27.3 on 10/12/2017.  Symptomatically, she complains of a month long episode of bronchitis. She is seeing her PCP. She denies any breast concerns. Exam reveals post-operative changes.  Labs are unremarkable.   Plan: 1.  Labs today:  CBC with diff, CMP, CA27.29. 2.  Schedule annual mammogram on 02/09/2018. 3.  Continue tamoxifen.  4.  RTC in 6 months for MD assessment and labs (CBC with diff, CMP, CA27.29).   Honor Loh, NP  10/12/2017, 3:36 PM   I saw and evaluated the patient, participating in the key portions of the service and reviewing pertinent diagnostic studies and records.  I reviewed the nurse practitioner's note and agree with the findings and the plan.  The assessment and plan were discussed with the patient.  A few questions were asked by the patient and answered.   Nolon Stalls, MD 10/12/2017,3:36 PM

## 2017-10-12 NOTE — Progress Notes (Signed)
Patient states she has had bronchitis since December.  She still has a cough she can't get rid of.  States she is taking OTC cough syrup.  Otherwise, no complaints.

## 2017-10-13 LAB — CANCER ANTIGEN 27.29: CA 27.29: 27.3 U/mL (ref 0.0–38.6)

## 2017-10-30 ENCOUNTER — Encounter: Payer: Self-pay | Admitting: Hematology and Oncology

## 2017-12-23 ENCOUNTER — Other Ambulatory Visit: Payer: Self-pay

## 2017-12-23 ENCOUNTER — Other Ambulatory Visit
Admission: RE | Admit: 2017-12-23 | Discharge: 2017-12-23 | Disposition: A | Discharge status: Home / Self Care | Payer: Medicare Other | Source: Ambulatory Visit | Attending: Internal Medicine | Admitting: Internal Medicine

## 2017-12-23 ENCOUNTER — Emergency Department: Payer: Medicare Other

## 2017-12-23 ENCOUNTER — Emergency Department
Admission: EM | Admit: 2017-12-23 | Discharge: 2017-12-24 | Disposition: A | Discharge status: Home / Self Care | Payer: Medicare Other | Attending: Emergency Medicine | Admitting: Emergency Medicine

## 2017-12-23 ENCOUNTER — Encounter: Payer: Self-pay | Admitting: Emergency Medicine

## 2017-12-23 DIAGNOSIS — K449 Diaphragmatic hernia without obstruction or gangrene: Secondary | ICD-10-CM | POA: Insufficient documentation

## 2017-12-23 DIAGNOSIS — M549 Dorsalgia, unspecified: Secondary | ICD-10-CM | POA: Diagnosis not present

## 2017-12-23 DIAGNOSIS — M79621 Pain in right upper arm: Secondary | ICD-10-CM | POA: Diagnosis not present

## 2017-12-23 DIAGNOSIS — Z0000 Encounter for general adult medical examination without abnormal findings: Secondary | ICD-10-CM | POA: Insufficient documentation

## 2017-12-23 DIAGNOSIS — R0789 Other chest pain: Secondary | ICD-10-CM | POA: Insufficient documentation

## 2017-12-23 DIAGNOSIS — Z853 Personal history of malignant neoplasm of breast: Secondary | ICD-10-CM | POA: Insufficient documentation

## 2017-12-23 DIAGNOSIS — Z87891 Personal history of nicotine dependence: Secondary | ICD-10-CM | POA: Diagnosis not present

## 2017-12-23 DIAGNOSIS — Z79899 Other long term (current) drug therapy: Secondary | ICD-10-CM | POA: Diagnosis not present

## 2017-12-23 DIAGNOSIS — R079 Chest pain, unspecified: Secondary | ICD-10-CM

## 2017-12-23 DIAGNOSIS — I10 Essential (primary) hypertension: Secondary | ICD-10-CM | POA: Diagnosis not present

## 2017-12-23 LAB — COMPREHENSIVE METABOLIC PANEL
ALBUMIN: 4.1 g/dL (ref 3.5–5.0)
ALT: 12 U/L — ABNORMAL LOW (ref 14–54)
ANION GAP: 9 (ref 5–15)
AST: 22 U/L (ref 15–41)
Alkaline Phosphatase: 69 U/L (ref 38–126)
BUN: 15 mg/dL (ref 6–20)
CO2: 26 mmol/L (ref 22–32)
Calcium: 8.7 mg/dL — ABNORMAL LOW (ref 8.9–10.3)
Chloride: 108 mmol/L (ref 101–111)
Creatinine, Ser: 0.79 mg/dL (ref 0.44–1.00)
GFR calc Af Amer: >60 mL/min (ref >60)
GFR calc non Af Amer: >60 mL/min (ref >60)
GLUCOSE: 177 mg/dL — AB (ref 65–99)
POTASSIUM: 3.4 mmol/L — AB (ref 3.5–5.1)
SODIUM: 143 mmol/L (ref 135–145)
Total Bilirubin: 0.4 mg/dL (ref 0.3–1.2)
Total Protein: 6.8 g/dL (ref 6.5–8.1)

## 2017-12-23 LAB — CBC WITH DIFFERENTIAL/PLATELET
BASOS ABS: 0 10*3/uL (ref 0–0.1)
BASOS PCT: 1 %
EOS ABS: 0.2 10*3/uL (ref 0–0.7)
Eosinophils Relative: 3 %
HCT: 39.8 % (ref 35.0–47.0)
Hemoglobin: 13.3 g/dL (ref 12.0–16.0)
Lymphocytes Relative: 31 %
Lymphs Abs: 1.7 10*3/uL (ref 1.0–3.6)
MCH: 32.3 pg (ref 26.0–34.0)
MCHC: 33.4 g/dL (ref 32.0–36.0)
MCV: 96.7 fL (ref 80.0–100.0)
MONO ABS: 0.6 10*3/uL (ref 0.2–0.9)
MONOS PCT: 11 %
Neutro Abs: 3 10*3/uL (ref 1.4–6.5)
Neutrophils Relative %: 54 %
Platelets: 165 10*3/uL (ref 150–440)
RBC: 4.12 MIL/uL (ref 3.80–5.20)
RDW: 13.3 % (ref 11.5–14.5)
WBC: 5.6 10*3/uL (ref 3.6–11.0)

## 2017-12-23 LAB — TROPONIN I: Troponin I: <0.03 ng/mL (ref <0.03)

## 2017-12-23 LAB — FIBRIN DERIVATIVES D-DIMER (ARMC ONLY): FIBRIN DERIVATIVES D-DIMER (ARMC): 1062.01 ng{FEU}/mL — AB (ref 0.00–499.00)

## 2017-12-23 MED ORDER — FAMOTIDINE 40 MG PO TABS: 40.0000 mg | ORAL_TABLET | Freq: Every evening | ORAL | 0 refills | Status: DC

## 2017-12-23 MED ORDER — FAMOTIDINE 20 MG PO TABS
40.0000 mg | ORAL_TABLET | Freq: Once | ORAL | Status: AC
  Administered 2017-12-24: 40 mg via ORAL
  Filled 2017-12-23: qty 2

## 2017-12-23 MED ORDER — SODIUM CHLORIDE 0.9 % IV BOLUS (SEPSIS)
1000.0000 mL | Freq: Once | INTRAVENOUS | Status: AC
  Administered 2017-12-23: 1000 mL via INTRAVENOUS

## 2017-12-23 MED ORDER — IOPAMIDOL (ISOVUE-370) INJECTION 76%
75.0000 mL | Freq: Once | INTRAVENOUS | Status: AC | PRN
  Administered 2017-12-23: 75 mL via INTRAVENOUS

## 2017-12-23 NOTE — ED Provider Notes (Signed)
[facility] Emergency Department Provider Note  ___________________________________________   First MD Initiated Contact with Patient 12/23/17 2122     (approximate)  I have reviewed the triage vital signs and the nursing notes.   HISTORY  Chief Complaint Abnormal Lab   HPI Cheryl Diaz is a 82 y.o. female with a history of breast cancer in remission but still on tamoxifen who is presenting to the emergency department with several weeks of aching chest pain across the front of her chest as well as right thoracic back pain.  She says that the pain is worse when she lays down to go to sleep at night.  Denies any shortness of breath.  Denies any worsening of the pain with deep breathing.  Was seen by her primary care doctor this afternoon who sent a d-dimer because of the chest pain and was resulted at over thousand.  The patient was then called by her primary care doctor's office to come to the hospital for a CT angiography for further evaluation.  The patient is pain-free at this time.   Past Medical History:  Diagnosis Date  . Anemia   . Arthritis   . Breast cancer ([place]) 10/2010   ER/PR+, Her2neu- RT LUMPECTOMY  . GERD (gastroesophageal reflux disease)   . Personal history of radiation therapy   . Radiation 2011   RIGHT BREAST CA    Patient Active Problem List   Diagnosis Date Noted  . Breast cancer in female [facility]) 02/12/2016  . [place] anemia 06/04/2014  . Acid reflux 05/17/2014  . Benign hypertension 05/17/2014  . Blood glucose elevated 05/17/2014  . HLD (hyperlipidemia) 05/17/2014    Past Surgical History:  Procedure Laterality Date  . BREAST BIOPSY Right 2012   +  . CARPAL TUNNEL RELEASE      Prior to Admission medications   Medication Sig Start Date End Date Taking? Authorizing Provider  acetaminophen (TYLENOL) 325 MG tablet Take 650 mg by mouth every 6 (six) hours as needed.   Yes [provider]  Calcium  Carb-Cholecalciferol (CALCIUM 600 + D PO) Take 1 tablet by mouth 2 (two) times daily.    Yes [provider]  cholecalciferol (VITAMIN D) 1000 units tablet Take 2,000 Units by mouth daily.   Yes [provider]  loratadine (CLARITIN) 10 MG tablet Take 10 mg by mouth daily as needed for allergies.   Yes [provider]  naproxen sodium (RA NAPROXEN SODIUM) 220 MG tablet Take by mouth.   Yes [provider]  tamoxifen (NOLVADEX) 20 MG tablet TAKE 1 TABLET AT BEDTIME. DO NOT TAKE IF YOU    ARE PREGNANT. DO NOT SKIP OR STOP A DOSE. TAKE  EXACLTY AS PRESCRIBED 11/02/16  Yes Corcoran, Melissa C, MD  cyanocobalamin (,VITAMIN B-12,) 1000 MCG/ML injection Inject 1,000 mcg into the muscle every 30 (thirty) days.    [provider]  Fluticasone-Salmeterol (ADVAIR DISKUS) 250-50 MCG/DOSE AEPB Inhale into the lungs every 12 (twelve) hours as needed. 09/14/16 09/14/17  [provider]  nitrofurantoin (MACRODANTIN) 100 MG capsule Take 100 mg by mouth 2 (two) times daily.    [provider]    Allergies Diclofenac; Penicillin g; and Cephalosporins  Family History  Problem Relation Age of Onset  . Breast cancer Neg Hx     Social History Social History   Tobacco Use  . Smoking status: Former Research scientist (life sciences)  . Smokeless tobacco: Never Used  Substance Use Topics  . Alcohol use: Not  on file  . Drug use: Not on file    Review of Systems  Constitutional: No fever/chills Eyes: No visual changes. ENT: No sore throat. Cardiovascular: As above Respiratory: Denies shortness of breath. Gastrointestinal: No abdominal pain.  No nausea, no vomiting.  No diarrhea.  No constipation. Genitourinary: Negative for dysuria. Musculoskeletal: As above Skin: Negative for rash. Neurological: Negative for headaches, focal weakness or numbness.   ____________________________________________   PHYSICAL EXAM:  VITAL SIGNS: ED Triage Vitals  Enc Vitals Group      BP 12/23/17 1816 (!) 148/77     Pulse Rate 12/23/17 1816 79     Resp --      Temp 12/23/17 1816 98.4 F (36.9 C)     Temp Source 12/23/17 1816 Oral     SpO2 12/23/17 1816 99 %     Weight 12/23/17 1819 148 lb (67.1 kg)     Height 12/23/17 1819 5\' 2"  (1.575 m)     Head Circumference --      Peak Flow --      Pain Score 12/23/17 1818 3     Pain Loc --      Pain Edu? --      Excl. in [place]? --     Constitutional: Alert and oriented. Well appearing and in no acute distress. Eyes: Conjunctivae are normal.  Head: Atraumatic. Nose: No congestion/rhinnorhea. Mouth/Throat: Mucous membranes are moist.  Neck: No stridor.   Cardiovascular: Normal rate, regular rhythm. Grossly normal heart sounds.  Chest pain not reproducible to palpation. Respiratory: Normal respiratory effort.  No retractions. Lungs CTAB. Gastrointestinal: Soft and nontender. No distention.  Musculoskeletal: No lower extremity tenderness nor edema.  No joint effusions.  Mild tenderness to palpation to the right rhomboid muscle groups.  No midline tenderness to palpation.  No deformity.  No ecchymosis visualized. Neurologic:  Normal speech and language. No gross focal neurologic deficits are appreciated. Skin:  Skin is warm, dry and intact. No rash noted. Psychiatric: Mood and affect are normal. Speech and behavior are normal.  ____________________________________________   LABS (all labs ordered are listed, but only abnormal results are displayed)  Labs Reviewed  COMPREHENSIVE METABOLIC PANEL - Abnormal; Notable for the following components:      Result Value   Potassium 3.4 (*)    Glucose, Bld 177 (*)    Calcium 8.7 (*)    ALT 12 (*)    All other components within normal limits  CBC WITH DIFFERENTIAL/PLATELET  TROPONIN I   ____________________________________________  EKG  ED ECG REPORT I, Doran Stabler, the attending physician, personally viewed and interpreted this ECG.   Date: 12/23/2017  EKG Time:  1825  Rate: 80  Rhythm: normal sinus rhythm  Axis: Normal  Intervals:Incomplete right bundle branch block.  ST&T Change: No ST segment elevation or depression.  No abnormal T wave inversion.  ____________________________________________  RADIOLOGY  No evidence of pulmonary embolus on the CTA.  Lungs otherwise clear except for minimal bibasilar atelectasis.  Cholelithiasis present.  Moderate hiatal hernia as well. ____________________________________________   PROCEDURES  Procedure(s) performed:   Procedures  Critical Care performed:   ____________________________________________   INITIAL IMPRESSION / ASSESSMENT AND PLAN / ED COURSE  Pertinent labs & imaging results that were available during my care of the patient were reviewed by me and considered in my medical decision making (see chart for details).  Differential diagnosis includes, but is not limited to, ACS, aortic dissection, pulmonary embolism, cardiac tamponade, pneumothorax, pneumonia, pericarditis, myocarditis, GI-related  causes including esophagitis/gastritis, and musculoskeletal chest wall pain.   As part of my medical decision making, I reviewed the following data within the [place] Notes from office visits.   ----------------------------------------- 11:32 PM on 12/23/2017 -----------------------------------------  Patient continues to be pain-free.  I will try the patient on Pepcid as she was found to have a hiatal hernia on her CT angiography.  She will be given a dose of Pepcid here and sent with a prescription for home use.  She will be following up with her primary care doctor. ____________________________________________   FINAL CLINICAL IMPRESSION(S) / ED DIAGNOSES  Chest pain.  Hiatal hernia.    NEW MEDICATIONS STARTED DURING THIS VISIT:  New Prescriptions   No medications on file     Note:  This document was prepared using Dragon voice recognition software and may  include unintentional dictation errors.     Orbie Pyo, MD 12/23/17 [phone]

## 2017-12-23 NOTE — ED Triage Notes (Signed)
Pt called by md to come to ed for elevated d dimer. Pt went to md for routine check and told him and right shoulder blade pain she has been having. Pt denies sob or decrease in ability to do ADLs

## 2017-12-23 NOTE — ED Notes (Signed)
Report from [place], rn.

## 2017-12-23 NOTE — ED Notes (Signed)
Pt in ct 

## 2017-12-24 NOTE — ED Notes (Signed)
Pt. Going home with family. 

## 2017-12-27 ENCOUNTER — Other Ambulatory Visit: Payer: Self-pay | Admitting: Family Medicine

## 2017-12-27 DIAGNOSIS — R1310 Dysphagia, unspecified: Secondary | ICD-10-CM

## 2017-12-29 ENCOUNTER — Ambulatory Visit
Admission: RE | Admit: 2017-12-29 | Discharge: 2017-12-29 | Disposition: A | Discharge status: Home / Self Care | Payer: Medicare Other | Source: Ambulatory Visit | Attending: Family Medicine | Admitting: Family Medicine

## 2017-12-29 ENCOUNTER — Encounter: Payer: Self-pay | Admitting: *Deleted

## 2017-12-29 DIAGNOSIS — R1310 Dysphagia, unspecified: Secondary | ICD-10-CM | POA: Diagnosis not present

## 2017-12-29 DIAGNOSIS — K222 Esophageal obstruction: Secondary | ICD-10-CM | POA: Diagnosis not present

## 2017-12-29 DIAGNOSIS — K449 Diaphragmatic hernia without obstruction or gangrene: Secondary | ICD-10-CM | POA: Insufficient documentation

## 2018-01-12 ENCOUNTER — Ambulatory Visit: Payer: Medicare Other | Admitting: Anesthesiology

## 2018-01-12 ENCOUNTER — Ambulatory Visit
Admission: RE | Admit: 2018-01-12 | Discharge: 2018-01-12 | Disposition: A | Discharge status: Home / Self Care | Payer: Medicare Other | Source: Ambulatory Visit | Attending: Ophthalmology | Admitting: Ophthalmology

## 2018-01-12 ENCOUNTER — Other Ambulatory Visit: Payer: Self-pay

## 2018-01-12 ENCOUNTER — Encounter
Admission: RE | Disposition: A | Discharge status: Home / Self Care | Payer: Self-pay | Source: Ambulatory Visit | Attending: Ophthalmology

## 2018-01-12 DIAGNOSIS — Z87891 Personal history of nicotine dependence: Secondary | ICD-10-CM | POA: Diagnosis not present

## 2018-01-12 DIAGNOSIS — H2512 Age-related nuclear cataract, left eye: Secondary | ICD-10-CM | POA: Insufficient documentation

## 2018-01-12 DIAGNOSIS — H2702 Aphakia, left eye: Secondary | ICD-10-CM | POA: Insufficient documentation

## 2018-01-12 DIAGNOSIS — Z79899 Other long term (current) drug therapy: Secondary | ICD-10-CM | POA: Diagnosis not present

## 2018-01-12 DIAGNOSIS — Z85828 Personal history of other malignant neoplasm of skin: Secondary | ICD-10-CM | POA: Diagnosis not present

## 2018-01-12 DIAGNOSIS — Z853 Personal history of malignant neoplasm of breast: Secondary | ICD-10-CM | POA: Insufficient documentation

## 2018-01-12 DIAGNOSIS — K219 Gastro-esophageal reflux disease without esophagitis: Secondary | ICD-10-CM | POA: Insufficient documentation

## 2018-01-12 DIAGNOSIS — Z880 Allergy status to penicillin: Secondary | ICD-10-CM | POA: Diagnosis not present

## 2018-01-12 DIAGNOSIS — H2189 Other specified disorders of iris and ciliary body: Secondary | ICD-10-CM | POA: Insufficient documentation

## 2018-01-12 HISTORY — PX: CATARACT EXTRACTION W/PHACO: SHX586

## 2018-01-12 HISTORY — DX: Personal history of other diseases of the digestive system: Z87.19

## 2018-01-12 SURGERY — PHACOEMULSIFICATION, CATARACT, WITH IOL INSERTION
Anesthesia: Monitor Anesthesia Care | Site: Eye | Laterality: Left | Wound class: Clean

## 2018-01-12 MED ORDER — MOXIFLOXACIN HCL 0.5 % OP SOLN
OPHTHALMIC | Status: DC | PRN
  Administered 2018-01-12: .2 mL via OPHTHALMIC

## 2018-01-12 MED ORDER — ARMC OPHTHALMIC DILATING DROPS
1.0000 "application " | OPHTHALMIC | Status: AC
  Administered 2018-01-12 (×3): 1 via OPHTHALMIC

## 2018-01-12 MED ORDER — SODIUM HYALURONATE 23 MG/ML IO SOLN
INTRAOCULAR | Status: DC | PRN
  Administered 2018-01-12: .6 mL via INTRAOCULAR

## 2018-01-12 MED ORDER — EPINEPHRINE PF 1 MG/ML IJ SOLN
INTRAMUSCULAR | Status: AC
  Filled 2018-01-12: qty 1

## 2018-01-12 MED ORDER — SODIUM HYALURONATE 23 MG/ML IO SOLN
INTRAOCULAR | Status: AC
  Filled 2018-01-12: qty 0.6

## 2018-01-12 MED ORDER — LIDOCAINE HCL (PF) 4 % IJ SOLN
INTRAOCULAR | Status: DC | PRN
  Administered 2018-01-12: 2 mL via OPHTHALMIC

## 2018-01-12 MED ORDER — NA CHONDROIT SULF-NA HYALURON 40-30 MG/ML IO SOLN
INTRAOCULAR | Status: DC | PRN
  Administered 2018-01-12: .5 mL via INTRAOCULAR

## 2018-01-12 MED ORDER — MOXIFLOXACIN HCL 0.5 % OP SOLN: 1.0000 [drp] | OPHTHALMIC | Status: DC | PRN

## 2018-01-12 MED ORDER — SODIUM HYALURONATE 10 MG/ML IO SOLN
INTRAOCULAR | Status: DC | PRN
  Administered 2018-01-12: .55 mL via INTRAOCULAR

## 2018-01-12 MED ORDER — LIDOCAINE HCL (PF) 4 % IJ SOLN
INTRAMUSCULAR | Status: AC
  Filled 2018-01-12: qty 5

## 2018-01-12 MED ORDER — NEOMYCIN-POLYMYXIN-DEXAMETH 3.5-10000-0.1 OP OINT
TOPICAL_OINTMENT | OPHTHALMIC | Status: AC
  Filled 2018-01-12: qty 3.5

## 2018-01-12 MED ORDER — MOXIFLOXACIN HCL 0.5 % OP SOLN
OPHTHALMIC | Status: AC
  Filled 2018-01-12: qty 3

## 2018-01-12 MED ORDER — POVIDONE-IODINE 5 % OP SOLN
OPHTHALMIC | Status: DC | PRN
  Administered 2018-01-12: 1 via OPHTHALMIC

## 2018-01-12 MED ORDER — POVIDONE-IODINE 5 % OP SOLN
OPHTHALMIC | Status: AC
  Filled 2018-01-12: qty 30

## 2018-01-12 MED ORDER — EPINEPHRINE PF 1 MG/ML IJ SOLN
INTRAOCULAR | Status: DC | PRN
  Administered 2018-01-12: 1 mL via OPHTHALMIC

## 2018-01-12 MED ORDER — TRIAMCINOLONE ACETONIDE 40 MG/ML IJ SUSP
INTRAMUSCULAR | Status: AC
  Filled 2018-01-12: qty 1

## 2018-01-12 MED ORDER — SODIUM CHLORIDE 0.9 % IV SOLN
INTRAVENOUS | Status: DC
  Administered 2018-01-12: 08:00:00 via INTRAVENOUS

## 2018-01-12 MED ORDER — MIDAZOLAM HCL 2 MG/2ML IJ SOLN
INTRAMUSCULAR | Status: DC | PRN
  Administered 2018-01-12 (×2): 1 mg via INTRAVENOUS

## 2018-01-12 MED ORDER — MIDAZOLAM HCL 2 MG/2ML IJ SOLN
INTRAMUSCULAR | Status: AC
  Filled 2018-01-12: qty 2

## 2018-01-12 MED ORDER — ARMC OPHTHALMIC DILATING DROPS
OPHTHALMIC | Status: AC
  Administered 2018-01-12: 1 via OPHTHALMIC
  Filled 2018-01-12: qty 0.4

## 2018-01-12 SURGICAL SUPPLY — 20 items
BANDAGE EYE OVAL (MISCELLANEOUS) ×6 IMPLANT
DISSECTOR HYDRO NUCLEUS 50X22 (MISCELLANEOUS) ×3 IMPLANT
GLOVE BIO SURGEON STRL SZ8 (GLOVE) ×3 IMPLANT
GLOVE BIOGEL M 6.5 STRL (GLOVE) ×3 IMPLANT
GLOVE SURG LX 7.5 STRW (GLOVE) ×2
GLOVE SURG LX STRL 7.5 STRW (GLOVE) ×1 IMPLANT
GOWN STRL REUS W/ TWL LRG LVL3 (GOWN DISPOSABLE) ×2 IMPLANT
GOWN STRL REUS W/TWL LRG LVL3 (GOWN DISPOSABLE) ×4
LABEL CATARACT MEDS ST (LABEL) ×3 IMPLANT
LENS IOL TECNIS ITEC 26.5 (Intraocular Lens) IMPLANT
PACK CATARACT (MISCELLANEOUS) ×3 IMPLANT
PACK CATARACT KING (MISCELLANEOUS) ×3 IMPLANT
PACK EYE AFTER SURG (MISCELLANEOUS) ×3 IMPLANT
PACK VIT ANT 23G (MISCELLANEOUS) ×3 IMPLANT
SOL BSS BAG (MISCELLANEOUS) ×3
SOLUTION BSS BAG (MISCELLANEOUS) ×1 IMPLANT
SUT ETHILON 10 0 CS140 6 (SUTURE) ×3 IMPLANT
WATER STERILE IRR 250ML POUR (IV SOLUTION) ×3 IMPLANT
WIPE NON LINTING 3.25X3.25 (MISCELLANEOUS) ×3 IMPLANT
eye pad ×6 IMPLANT

## 2018-01-12 NOTE — Anesthesia Preprocedure Evaluation (Signed)
Anesthesia Evaluation  Patient identified by MRN, date of birth, ID band Patient awake    Reviewed: Allergy & Precautions, NPO status , Patient's Chart, lab work & pertinent test results  History of Anesthesia Complications Negative for: history of anesthetic complications  Airway Mallampati: II  TM Distance: >3 FB Neck ROM: Full    Dental  (+) Partial Lower, Partial Upper   Pulmonary neg sleep apnea, neg COPD, former smoker,    breath sounds clear to auscultation- rhonchi (-) wheezing      Cardiovascular hypertension, (-) CAD, (-) Past MI, (-) Cardiac Stents and (-) CABG  Rhythm:Regular Rate:Normal - Systolic murmurs and - Diastolic murmurs    Neuro/Psych negative neurological ROS  negative psych ROS   GI/Hepatic Neg liver ROS, hiatal hernia, GERD  ,  Endo/Other  negative endocrine ROSneg diabetes  Renal/GU negative Renal ROS     Musculoskeletal  (+) Arthritis ,   Abdominal (+) - obese,   Peds  Hematology  (+) anemia ,   Anesthesia Other Findings Past Medical History: No date: Anemia No date: Arthritis 10/2010: Breast cancer ([place])     Comment:  ER/PR+, Her2neu- RT LUMPECTOMY No date: GERD (gastroesophageal reflux disease) No date: History of hiatal hernia No date: Personal history of radiation therapy 2011: Radiation     Comment:  RIGHT BREAST CA   Reproductive/Obstetrics                             Anesthesia Physical Anesthesia Plan  ASA: II  Anesthesia Plan: MAC   Post-op Pain Management:    Induction: Intravenous  PONV Risk Score and Plan: 2 and Midazolam  Airway Management Planned: Natural Airway  Additional Equipment:   Intra-op Plan:   Post-operative Plan:   Informed Consent: I have reviewed the patients History and Physical, chart, labs and discussed the procedure including the risks, benefits and alternatives for the proposed anesthesia with the patient or  authorized representative who has indicated his/her understanding and acceptance.     Plan Discussed with: CRNA and Anesthesiologist  Anesthesia Plan Comments:         Anesthesia Quick Evaluation

## 2018-01-12 NOTE — Brief Op Note (Signed)
OPERATIVE NOTE  Cheryl Diaz [id number] 01/12/2018   PREOPERATIVE DIAGNOSIS:  Nuclear sclerotic cataract left eye.  H25.12   POSTOPERATIVE DIAGNOSIS:    Nuclear sclerotic cataract left eye.     PROCEDURE:   1.  Phacoemulsification.  Eye was left aphakic. 2.  Anterior vitrectomy 67010    ULTRASOUND TIME: 1 minutes 57 seconds.  CDE 16.99   SURGEON:  Benay Pillow, MD, MPH   ANESTHESIA:  Topical with tetracaine drops augmented with 1% preservative-free intracameral lidocaine.  ESTIMATED BLOOD LOSS: <1 mL   COMPLICATIONS:  posterior capsular rupture with lens fragments lost to posterior segment.Marland Kitchen

## 2018-01-12 NOTE — Transfer of Care (Signed)
Immediate Anesthesia Transfer of Care Note  Patient: Cheryl Diaz  Procedure(s) Performed: CATARACT EXTRACTION PHACO AND INTRAOCULAR LENS PLACEMENT ([place]) suture placed in left eye at end of proedure no lens inserted anterior vitrectomy (Left Eye)  Patient Location: PACU and Short Stay  Anesthesia Type:MAC  Level of Consciousness: awake  Airway & Oxygen Therapy: Patient Spontanous Breathing  Post-op Assessment: Report given to RN  Post vital signs: stable  Last Vitals:  Vitals Value Taken Time  BP    Temp    Pulse    Resp    SpO2      Last Pain:  Vitals:   01/12/18 0747  TempSrc: Temporal  PainSc: 0-No pain         Complications: No apparent anesthesia complications

## 2018-01-12 NOTE — Anesthesia Postprocedure Evaluation (Signed)
Anesthesia Post Note  Patient: Cheryl Diaz  Procedure(s) Performed: CATARACT EXTRACTION PHACO AND INTRAOCULAR LENS PLACEMENT ([place]) suture placed in left eye at end of proedure no lens inserted anterior vitrectomy (Left Eye)  Patient location during evaluation: PACU Anesthesia Type: MAC Level of consciousness: awake and alert and oriented Pain management: pain level controlled Vital Signs Assessment: post-procedure vital signs reviewed and stable Respiratory status: spontaneous breathing, nonlabored ventilation and respiratory function stable Cardiovascular status: blood pressure returned to baseline and stable Postop Assessment: no signs of nausea or vomiting Anesthetic complications: no     Last Vitals:  Vitals:   01/12/18 1102 01/12/18 1105  BP: (!) 149/72 (!) 159/62  Pulse: 78   Resp: 18   Temp: (!) 36.3 C   SpO2: 100%     Last Pain:  Vitals:   01/12/18 1102  TempSrc:   PainSc: 0-No pain                 Havard Radigan

## 2018-01-12 NOTE — H&P (Signed)
The History and Physical notes are on paper, have been signed, and are to be scanned.   I have examined the patient and there are no changes to the H&P.   Cheryl Diaz 01/12/2018 9:39 AM

## 2018-01-12 NOTE — Op Note (Signed)
OPERATIVE NOTE  Cheryl Diaz [id number] 01/12/2018   PREOPERATIVE DIAGNOSIS:  Nuclear sclerotic cataract left eye.  H25.12   POSTOPERATIVE DIAGNOSIS:    Nuclear sclerotic cataract left eye.     PROCEDURE:   1.  Phacoemulsification.  Eye was left aphakic. 2. Anterior vitrectomy  CPT 67010    ULTRASOUND TIME: 1 minutes 57 seconds.  CDE 16.99   SURGEON:  Benay Pillow, MD, MPH   ANESTHESIA:  Topical with tetracaine drops augmented with 1% preservative-free intracameral lidocaine.  ESTIMATED BLOOD LOSS: <1 mL   COMPLICATIONS:  posterior capsular rupture with small lens fragments lost to posterior segment <15%..   DESCRIPTION OF PROCEDURE:  The patient was identified in the holding room and transported to the operating room and placed in the supine position under the operating microscope.  The left eye was identified as the operative eye and it was prepped and draped in the usual sterile ophthalmic fashion.   A 1.0 millimeter clear-corneal paracentesis was made at the 5:00 position. 0.5 ml of preservative-free 1% lidocaine with epinephrine was injected into the anterior chamber.  The anterior chamber was filled with Healon 5 viscoelastic.  A 2.4 millimeter keratome was used to make a near-clear corneal incision at the 2:00 position.  A curvilinear capsulorrhexis was made with a cystotome and capsulorrhexis forceps.  Balanced salt solution was used to hydrodissect and hydrodelineate the nucleus.   Phacoemulsification was then used in stop and chop fashion to remove the lens nucleus and epinucleus.   The lens was very dense and deep and chopping was difficult.  After removal of about 1/2 of the lens nucleus a small posterior capsular rupture was noted.  Viscoat was injected to tamponade the vitreous.  Most of the rest of the lens was successfully removed.  An anterior vitrectomy was performed.  The remaining cortex was removed using the vitrector and at this time a small fragment was  lost to the posterior segment.  Some kenalog was injected into the anterior chamber to identify any remaining vitreous in the anterior chamber and additional anterior vitrectomy was performed.    At one point during viscoat injection there was iris prolapse.  The iris was mildly floppy and atrophic.  At this point the cornea was becoming cloudy and the anterior capsule could not be visualized, so the decision was made to leave the eye aphakic at this time.  The patient will need another surgery to remove the lens fragments lost to the posterior segment.    A 10-0 nylon suture was placed through the main wound.  Wounds were hydrated with balanced salt solution.  The anterior chamber was inflated to a physiologic pressure with balanced salt solution.  Intracameral vigamox 0.1 mL undiltued was injected into the eye and a drop placed onto the ocular surface. No wound leaks were noted.  Maxitrol ointment and a patch and shield were placed on the eye.  The patient was taken to the recovery room in stable condition.   The complication was disclosed to the patient and the patient's family.   Benay Pillow 01/12/2018, 11:00 AM

## 2018-01-12 NOTE — Discharge Instructions (Signed)
Eye Surgery Discharge Instructions  Expect mild scratchy sensation or mild soreness. DO NOT RUB YOUR EYE!  The day of surgery:  Minimal physical activity, but bed rest is not required  No reading, computer work, or close hand work  No bending, lifting, or straining.  May watch TV  For 24 hours:  No driving, legal decisions, or alcoholic beverages  Safety precautions  Eat anything you prefer: It is better to start with liquids, then soup then solid foods.  _____ Eye patch should be worn until postoperative exam tomorrow.  ____ Solar shield eyeglasses should be worn for comfort in the sunlight/patch while sleeping  Resume all regular medications including aspirin or Coumadin if these were discontinued prior to surgery. You may shower, bathe, shave, or wash your hair. Tylenol may be taken for mild discomfort.  Call your doctor if you experience significant pain, nausea, or vomiting, fever > 101 or other signs of infection. [phone] or [phone] Specific instructions:  Follow-up Information    Eulogio Bear, MD Follow up.   Specialty:  Ophthalmology Why:  April 5 at 10:30am Contact information: [place] [id number] [phone]

## 2018-01-12 NOTE — Anesthesia Post-op Follow-up Note (Signed)
Anesthesia QCDR form completed.        

## 2018-01-30 ENCOUNTER — Other Ambulatory Visit: Payer: Self-pay | Admitting: Hematology and Oncology

## 2018-04-03 ENCOUNTER — Ambulatory Visit
Admission: RE | Admit: 2018-04-03 | Discharge: 2018-04-03 | Disposition: A | Discharge status: Home / Self Care | Payer: Medicare Other | Source: Ambulatory Visit | Attending: Urgent Care | Admitting: Urgent Care

## 2018-04-03 DIAGNOSIS — C50919 Malignant neoplasm of unspecified site of unspecified female breast: Secondary | ICD-10-CM

## 2018-04-03 DIAGNOSIS — Z170 Estrogen receptor positive status [ER+]: Secondary | ICD-10-CM | POA: Diagnosis present

## 2018-04-03 DIAGNOSIS — Z853 Personal history of malignant neoplasm of breast: Secondary | ICD-10-CM | POA: Insufficient documentation

## 2018-04-03 DIAGNOSIS — Z1231 Encounter for screening mammogram for malignant neoplasm of breast: Secondary | ICD-10-CM | POA: Insufficient documentation

## 2018-04-04 ENCOUNTER — Other Ambulatory Visit: Payer: Self-pay | Admitting: Urgent Care

## 2018-04-04 DIAGNOSIS — N6489 Other specified disorders of breast: Secondary | ICD-10-CM

## 2018-04-04 DIAGNOSIS — R928 Other abnormal and inconclusive findings on diagnostic imaging of breast: Secondary | ICD-10-CM

## 2018-04-11 ENCOUNTER — Ambulatory Visit
Admission: RE | Admit: 2018-04-11 | Discharge: 2018-04-11 | Disposition: A | Discharge status: Home / Self Care | Payer: Medicare Other | Source: Ambulatory Visit | Attending: Urgent Care | Admitting: Urgent Care

## 2018-04-11 DIAGNOSIS — N6489 Other specified disorders of breast: Secondary | ICD-10-CM

## 2018-04-11 DIAGNOSIS — R928 Other abnormal and inconclusive findings on diagnostic imaging of breast: Secondary | ICD-10-CM | POA: Diagnosis present

## 2018-04-12 ENCOUNTER — Inpatient Hospital Stay: Payer: Medicare Other | Admitting: Hematology and Oncology

## 2018-04-12 ENCOUNTER — Inpatient Hospital Stay: Payer: Medicare Other

## 2018-04-12 NOTE — Progress Notes (Deleted)
[facility] day:  04/12/2018  Chief Complaint: Cheryl Diaz is a 82 y.o. female with stage I right breast cancer who is seen for 6 month assessment on tamoxifen.   HPI:  The patient was last seen in the medical oncology clinic on 10/12/2017.  At that time, she complained of a month long episode of bronchitis.  She denied any breast concerns. Exam revealed post-operative changes.  Labs were unremarkable.   Screening bilateral mammogram on 04/03/2018 revealed possible asymmetry in the left breast.  Diagnostic mammogram on 04/11/2018 revealed no evidence of malignancy in the left breast.  Screening mammogram in 1 year was recommended.  She underwent left cataract surgery on 01/12/2018.  During the interim,    Past Medical History:  Diagnosis Date  . Anemia   . Arthritis   . Breast cancer ([place]) 10/2010   ER/PR+, Her2neu- RT LUMPECTOMY  . GERD (gastroesophageal reflux disease)   . History of hiatal hernia   . Personal history of radiation therapy   . Radiation 2011   RIGHT BREAST CA    Past Surgical History:  Procedure Laterality Date  . BREAST BIOPSY Right 2012   +  . BREAST LUMPECTOMY Right 2012  . CARPAL TUNNEL RELEASE    . CATARACT EXTRACTION W/PHACO Left 01/12/2018   Procedure: CATARACT EXTRACTION PHACO AND INTRAOCULAR LENS PLACEMENT (IOC) suture placed in left eye at end of proedure no lens inserted anterior vitrectomy;  Surgeon: Eulogio Bear, MD;  Location: [facility] ORS;  Service: Ophthalmology;  Laterality: Left;  [place]  AP% CDE Fluid pack lot # [id number]  . FINGER SURGERY     I&D    Family History  Problem Relation Age of Onset  . Breast cancer Neg Hx     Social History:  reports that she has quit smoking. She has never used smokeless tobacco. She reports that she drank alcohol. She reports that she does not use drugs.  She lives in [place].  Her husband is handicapped and is almost totally blind.  The patient lives in  [place].  The patient is alone today.  Allergies:  Allergies  Allergen Reactions  . Diclofenac Other (See Comments)  . Penicillin G Other (See Comments)    lumps and swelling, hives  . Cephalosporins Rash    Current Medications: Current Outpatient Medications  Medication Sig Dispense Refill  . acetaminophen (TYLENOL) 325 MG tablet Take 650 mg by mouth every 6 (six) hours as needed.    . Calcium Carb-Cholecalciferol (CALCIUM 600 + D PO) Take 1 tablet by mouth 2 (two) times daily.     . cholecalciferol (VITAMIN D) 1000 units tablet Take 2,000 Units by mouth daily.    . cyanocobalamin (,VITAMIN B-12,) 1000 MCG/ML injection Inject 1,000 mcg into the muscle every 30 (thirty) days.    . famotidine (PEPCID) 40 MG tablet Take 1 tablet (40 mg total) by mouth every evening. 10 tablet 0  . Fluticasone-Salmeterol (ADVAIR DISKUS) 250-50 MCG/DOSE AEPB Inhale into the lungs every 12 (twelve) hours as needed.    . loratadine (CLARITIN) 10 MG tablet Take 10 mg by mouth daily as needed for allergies.    . naproxen sodium (RA NAPROXEN SODIUM) 220 MG tablet Take by mouth.    . nitrofurantoin (MACRODANTIN) 100 MG capsule Take 100 mg by mouth 2 (two) times daily.    . tamoxifen (NOLVADEX) 20 MG tablet TAKE 1 TABLET AT BEDTIME. DO NOT TAKE IF YOU ARE PREGNANT. DO NOT  SKIP OR STOP A DOSE. TAKE EXACTLY AS PRESCRIBED 270 tablet 2   No current facility-administered medications for this visit.     Review of Systems:  GENERAL:  Feels"ok".  No fevers or sweats.  Weight stable. PERFORMANCE STATUS (ECOG):  1 HEENT:  No visual changes, runny nose, sore throat, mouth sores or tenderness. Lungs: No shortness of breath.  Interval bronchitis.  Cough (see HPI).  No hemoptysis. Cardiac:  No chest pain, palpitations, orthopnea, or PND. GI: Not eating well.   No nausea, vomiting, diarrhea, constipation, melena or hematochezia. GU:  No urgency, frequency, dysuria, or hematuria. Musculoskeletal:  Scoliosis.  No back pain.   Arthritis in thumbs (right > left).  No muscle tenderness. Extremities:  No pain or swelling. Skin:  Sore under right arm.  No rashes or skin changes. Neuro:  Poor memory.  No headache, numbness or weakness, balance or coordination issues. Endocrine:  No diabetes, thyroid issues, hot flashes or night sweats. Psych:  No mood changes, depression or anxiety. Pain:  No focal pain. Review of systems:  All other systems reviewed and found to be negative.  Physical Exam: There were no vitals taken for this visit. GENERAL:  Well developed, well nourished, woman sitting comfortably in the exam room in no acute distress. MENTAL STATUS:  Alert and oriented to person, place and time. HEAD: Pearline Cables styled hair.  Normocephalic, atraumatic, face symmetric, no Cushingoid features. EYES:  Glasses.  Blue eyes.  Pupils equal round and reactive to light and accomodation.  No conjunctivitis or scleral icterus. ENT:  Oropharynx clear without lesion.  Tongue normal.  Upper plate.  Mucous membranes moist.  RESPIRATORY:  Clear to auscultation without rales, wheezes or rhonchi. CARDIOVASCULAR:  Regular rate and rhythm without murmur, rub or gallop. BREAST:  Right breast with post-operative and post-radiation changes between 11 and 2 o'clock (semi-circular incision).  Edema inferiorly (stable).  Inverted nipple.  No discrete masses, skin changes or nipple discharge.  Left breast with fibrocystic changes in the outer quadrants.  No masses, skin changes or nipple discharge.  ABDOMEN:  Soft, non-tender, with active bowel sounds, and no hepatosplenomegaly.  No masses. SKIN:  No rashes, ulcers or lesions. EXTREMITIES: No edema, no skin discoloration or tenderness.  No palpable cords. LYMPH NODES: No palpable cervical, supraclavicular, axillary or inguinal adenopathy  NEUROLOGICAL: Unremarkable. PSYCH:  Appropriate.   No visits with results within 3 Day(s) from this visit.  Latest known visit with results is:  Admission on  12/23/2017, Discharged on 12/24/2017  Component Date Value Ref Range Status  . WBC 12/23/2017 5.6  3.6 - 11.0 K/uL Final  . RBC 12/23/2017 4.12  3.80 - 5.20 MIL/uL Final  . Hemoglobin 12/23/2017 13.3  12.0 - 16.0 g/dL Final  . HCT 12/23/2017 39.8  35.0 - 47.0 % Final  . MCV 12/23/2017 96.7  80.0 - 100.0 fL Final  . MCH 12/23/2017 32.3  26.0 - 34.0 pg Final  . MCHC 12/23/2017 33.4  32.0 - 36.0 g/dL Final  . RDW 12/23/2017 13.3  11.5 - 14.5 % Final  . Platelets 12/23/2017 165  150 - 440 K/uL Final  . Neutrophils Relative % 12/23/2017 54  % Final  . Neutro Abs 12/23/2017 3.0  1.4 - 6.5 K/uL Final  . Lymphocytes Relative 12/23/2017 31  % Final  . Lymphs Abs 12/23/2017 1.7  1.0 - 3.6 K/uL Final  . Monocytes Relative 12/23/2017 11  % Final  . Monocytes Absolute 12/23/2017 0.6  0.2 - 0.9 K/uL Final  .  Eosinophils Relative 12/23/2017 3  % Final  . Eosinophils Absolute 12/23/2017 0.2  0 - 0.7 K/uL Final  . Basophils Relative 12/23/2017 1  % Final  . Basophils Absolute 12/23/2017 0.0  0 - 0.1 K/uL Final   Performed at [facility], [address] [place], [place] [address]  . Sodium 12/23/2017 143  135 - 145 mmol/L Final  . Potassium 12/23/2017 3.4* 3.5 - 5.1 mmol/L Final  . Chloride 12/23/2017 108  101 - 111 mmol/L Final  . CO2 12/23/2017 26  22 - 32 mmol/L Final  . Glucose, Bld 12/23/2017 177* 65 - 99 mg/dL Final  . BUN 12/23/2017 15  6 - 20 mg/dL Final  . Creatinine, Ser 12/23/2017 0.79  0.44 - 1.00 mg/dL Final  . Calcium 12/23/2017 8.7* 8.9 - 10.3 mg/dL Final  . Total Protein 12/23/2017 6.8  6.5 - 8.1 g/dL Final  . Albumin 12/23/2017 4.1  3.5 - 5.0 g/dL Final  . AST 12/23/2017 22  15 - 41 U/L Final  . ALT 12/23/2017 12* 14 - 54 U/L Final  . Alkaline Phosphatase 12/23/2017 69  38 - 126 U/L Final  . Total Bilirubin 12/23/2017 0.4  0.3 - 1.2 mg/dL Final  . GFR calc non Af Amer 12/23/2017 >60  >60 mL/min Final  . GFR calc Af Amer 12/23/2017 >60  >60 mL/min Final   Comment:  (NOTE) The eGFR has been calculated using the CKD EPI equation. This calculation has not been validated in all clinical situations. eGFR's persistently <60 mL/min signify possible Chronic Kidney Disease.   Georgiann Hahn gap 12/23/2017 9  5 - 15 Final   Performed at [facility], [address] [place], [place] [address]  . Troponin I 12/23/2017 <0.03  <0.03 ng/mL Final   Performed at [facility], [address] [place], [place] [address]    Assessment:  Cheryl Diaz is a 82 y.o. female with stage I right breast cancer s/p lumpectomy followed by partial mastectomy.  Lumpectomy on 10/06/2010 revealed a 1.3 cm grade II invasive ductal carcinoma. 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 There was no lymphovascular invasion. Tumor was ER + (> 90%) PR + (> 90%) and HER-2/neu - by FISH.    She underwent right partial mastectomy (for margins) and sentinel lymph node biopsy on 10/21/2010.  There was no evidence of residual in situ or invasive carcinoma. There were fibrocystic changes and ductal hyperplasia.  Lymph node biopsy revealed no macrometastasis.  Pathologic stage was T1cN0.  Bilateral diagnostic mammogram on 02/09/2017 revealed no evidence of malignancy. Screening bilateral mammogram on 04/03/2018 revealed possible asymmetry in the left breast.  Diagnostic left mammogram on 04/11/2018 revealed no evidence of malignancy.    Oncotype DX score was 15.  She was treated with the radiation.  She received a Arimidex from 02/12/2011 - 05/15/2011. Arimidex was discontinued secondary to poor tolerance.  She began Tamoxifen on 05/15/2011.  BCI testing on 08/01/2015 revealed a 4.3% risk of late recurrence between years 5 and 10 (low risk category) with a high likelihood of benefit for extended adjuvant therapy.  CA27.29 has been followed: 38.7 on 02/24/2011, 27.4 on 09/10/2011, 32.8 on 03/10/2012, 30.8 on 07/24/2015,  31.6 on 03/16/2017, and 27.3 on 10/12/2017.  Symptomatically, she complains of a month long episode of bronchitis. She is seeing her PCP. She denies any breast concerns. Exam reveals post-operative changes.  Labs are unremarkable.   Plan: 1.  Labs today:  CBC with diff, CMP, CA27.29. 2.  Review interval screening and left diagnostic mammogram- no evidence of malignancy. 3.  Anticipate screening mammogram on 04/04/2019. 4.  Continue tamoxifen.  5.  RTC in 6 months for MD assessment and labs (CBC with diff, CMP, CA27.29).   Lequita Asal, MD  04/12/2018, 6:03 AM   I saw and evaluated the patient, participating in the key portions of the service and reviewing pertinent diagnostic studies and records.  I reviewed the nurse practitioner's note and agree with the findings and the plan.  The assessment and plan were discussed with the patient.  A few questions were asked by the patient and answered.   Nolon Stalls, MD 04/12/2018,6:03 AM

## 2018-05-17 ENCOUNTER — Inpatient Hospital Stay: Payer: Medicare Other

## 2018-05-17 ENCOUNTER — Inpatient Hospital Stay: Payer: Medicare Other | Admitting: Hematology and Oncology

## 2018-05-17 NOTE — Progress Notes (Deleted)
[place] [facility] day:  05/17/2018  Chief Complaint: Cheryl Diaz is a 82 y.o. female with stage I right breast cancer who is seen for 6 month assessment on tamoxifen.   HPI:  The patient was last seen in the medical oncology clinic on 10/12/2017.  At that time, she complained of a month long episode of bronchitis. She was seeing her PCP. She denied any breast concerns. Exam revealed post-operative changes.  Labs were unremarkable. CA27.29 was normal.  She was seen in the [facility] ER on 12/23/2017.  She described several weeks of aching in her chest and right sided thoracic back pain.  D-dimer was elevated.  Chest CT angiogram on 12/23/2017 revealed no evidence of pulmonary embolism.  There was a moderate hiatal hernia.  There was cholelithiasis with an unremarkable gallbladder.  Barium swallow on 12/30/2107 revealed a normal appearing hypopharynx and cervical esophagus.  There was a mild short-segment distal esophageal stricture just above a reducible hiatal hernia. This resulted in transient hang up of the 13 mm barium tablet but no obstruction to the liquid barium.  There were no mucosal changes to suggest esophagitis.  Bilateral screening mammogram on 04/03/2018 revealed possible asymmetry of the left breast.  Left diagnostic mammogram on 04/11/2018 revealed no evidence of malignancy.  Screening mammogram was recommended in 1 year.  During the interim,    Past Medical History:  Diagnosis Date  . Anemia   . Arthritis   . Breast cancer ([place]) 10/2010   ER/PR+, Her2neu- RT LUMPECTOMY  . GERD (gastroesophageal reflux disease)   . History of hiatal hernia   . Personal history of radiation therapy   . Radiation 2011   RIGHT BREAST CA    Past Surgical History:  Procedure Laterality Date  . BREAST BIOPSY Right 2012   +  . BREAST LUMPECTOMY Right 2012  . CARPAL TUNNEL RELEASE    . CATARACT EXTRACTION W/PHACO Left 01/12/2018   Procedure:  CATARACT EXTRACTION PHACO AND INTRAOCULAR LENS PLACEMENT (IOC) suture placed in left eye at end of proedure no lens inserted anterior vitrectomy;  Surgeon: Eulogio Bear, MD;  Location: [facility] ORS;  Service: Ophthalmology;  Laterality: Left;  [place]  AP% CDE Fluid pack lot # [id number]  . FINGER SURGERY     I&D    Family History  Problem Relation Age of Onset  . Breast cancer Neg Hx     Social History:  reports that she has quit smoking. She has never used smokeless tobacco. She reports that she drank alcohol. She reports that she does not use drugs.  She lives in [place].  Her husband is handicapped and is almost totally blind.  The patient lives in [place].  The patient is alone today.  Allergies:  Allergies  Allergen Reactions  . Diclofenac Other (See Comments)  . Penicillin G Other (See Comments)    lumps and swelling, hives  . Cephalosporins Rash    Current Medications: Current Outpatient Medications  Medication Sig Dispense Refill  . acetaminophen (TYLENOL) 325 MG tablet Take 650 mg by mouth every 6 (six) hours as needed.    . Calcium Carb-Cholecalciferol (CALCIUM 600 + D PO) Take 1 tablet by mouth 2 (two) times daily.     . cholecalciferol (VITAMIN D) 1000 units tablet Take 2,000 Units by mouth daily.    . cyanocobalamin (,VITAMIN B-12,) 1000 MCG/ML injection Inject 1,000 mcg into the muscle every 30 (thirty) days.    . famotidine (PEPCID) 40  MG tablet Take 1 tablet (40 mg total) by mouth every evening. 10 tablet 0  . Fluticasone-Salmeterol (ADVAIR DISKUS) 250-50 MCG/DOSE AEPB Inhale into the lungs every 12 (twelve) hours as needed.    . loratadine (CLARITIN) 10 MG tablet Take 10 mg by mouth daily as needed for allergies.    . naproxen sodium (RA NAPROXEN SODIUM) 220 MG tablet Take by mouth.    . nitrofurantoin (MACRODANTIN) 100 MG capsule Take 100 mg by mouth 2 (two) times daily.    . tamoxifen (NOLVADEX) 20 MG tablet TAKE 1 TABLET AT BEDTIME. DO NOT TAKE IF YOU ARE PREGNANT.  DO NOT SKIP OR STOP A DOSE. TAKE EXACTLY AS PRESCRIBED 270 tablet 2   No current facility-administered medications for this visit.     Review of Systems:  GENERAL:  Feels"ok".  No fevers or sweats.  Weight stable. PERFORMANCE STATUS (ECOG):  1 HEENT:  No visual changes, runny nose, sore throat, mouth sores or tenderness. Lungs: No shortness of breath.  Interval bronchitis.  Cough (see HPI).  No hemoptysis. Cardiac:  No chest pain, palpitations, orthopnea, or PND. GI: Not eating well.   No nausea, vomiting, diarrhea, constipation, melena or hematochezia. GU:  No urgency, frequency, dysuria, or hematuria. Musculoskeletal:  Scoliosis.  No back pain.  Arthritis in thumbs (right > left).  No muscle tenderness. Extremities:  No pain or swelling. Skin:  Sore under right arm.  No rashes or skin changes. Neuro:  Poor memory.  No headache, numbness or weakness, balance or coordination issues. Endocrine:  No diabetes, thyroid issues, hot flashes or night sweats. Psych:  No mood changes, depression or anxiety. Pain:  No focal pain. Review of systems:  All other systems reviewed and found to be negative.  Physical Exam: There were no vitals taken for this visit. GENERAL:  Well developed, well nourished, woman sitting comfortably in the exam room in no acute distress. MENTAL STATUS:  Alert and oriented to person, place and time. HEAD: Pearline Cables styled hair.  Normocephalic, atraumatic, face symmetric, no Cushingoid features. EYES:  Glasses.  Blue eyes.  Pupils equal round and reactive to light and accomodation.  No conjunctivitis or scleral icterus. ENT:  Oropharynx clear without lesion.  Tongue normal.  Upper plate.  Mucous membranes moist.  RESPIRATORY:  Clear to auscultation without rales, wheezes or rhonchi. CARDIOVASCULAR:  Regular rate and rhythm without murmur, rub or gallop. BREAST:  Right breast with post-operative and post-radiation changes between 11 and 2 o'clock (semi-circular incision).   Edema inferiorly (stable).  Inverted nipple.  No discrete masses, skin changes or nipple discharge.  Left breast with fibrocystic changes in the outer quadrants.  No masses, skin changes or nipple discharge.  ABDOMEN:  Soft, non-tender, with active bowel sounds, and no hepatosplenomegaly.  No masses. SKIN:  No rashes, ulcers or lesions. EXTREMITIES: No edema, no skin discoloration or tenderness.  No palpable cords. LYMPH NODES: No palpable cervical, supraclavicular, axillary or inguinal adenopathy  NEUROLOGICAL: Unremarkable. PSYCH:  Appropriate.   No visits with results within 3 Day(s) from this visit.  Latest known visit with results is:  Admission on 12/23/2017, Discharged on 12/24/2017  Component Date Value Ref Range Status  . WBC 12/23/2017 5.6  3.6 - 11.0 K/uL Final  . RBC 12/23/2017 4.12  3.80 - 5.20 MIL/uL Final  . Hemoglobin 12/23/2017 13.3  12.0 - 16.0 g/dL Final  . HCT 12/23/2017 39.8  35.0 - 47.0 % Final  . MCV 12/23/2017 96.7  80.0 - 100.0 fL Final  .  MCH 12/23/2017 32.3  26.0 - 34.0 pg Final  . MCHC 12/23/2017 33.4  32.0 - 36.0 g/dL Final  . RDW 12/23/2017 13.3  11.5 - 14.5 % Final  . Platelets 12/23/2017 165  150 - 440 K/uL Final  . Neutrophils Relative % 12/23/2017 54  % Final  . Neutro Abs 12/23/2017 3.0  1.4 - 6.5 K/uL Final  . Lymphocytes Relative 12/23/2017 31  % Final  . Lymphs Abs 12/23/2017 1.7  1.0 - 3.6 K/uL Final  . Monocytes Relative 12/23/2017 11  % Final  . Monocytes Absolute 12/23/2017 0.6  0.2 - 0.9 K/uL Final  . Eosinophils Relative 12/23/2017 3  % Final  . Eosinophils Absolute 12/23/2017 0.2  0 - 0.7 K/uL Final  . Basophils Relative 12/23/2017 1  % Final  . Basophils Absolute 12/23/2017 0.0  0 - 0.1 K/uL Final   Performed at [facility], [address], [place], [place] [address]  . Sodium 12/23/2017 143  135 - 145 mmol/L Final  . Potassium 12/23/2017 3.4* 3.5 - 5.1 mmol/L Final  . Chloride 12/23/2017 108  101 - 111 mmol/L Final  . CO2  12/23/2017 26  22 - 32 mmol/L Final  . Glucose, Bld 12/23/2017 177* 65 - 99 mg/dL Final  . BUN 12/23/2017 15  6 - 20 mg/dL Final  . Creatinine, Ser 12/23/2017 0.79  0.44 - 1.00 mg/dL Final  . Calcium 12/23/2017 8.7* 8.9 - 10.3 mg/dL Final  . Total Protein 12/23/2017 6.8  6.5 - 8.1 g/dL Final  . Albumin 12/23/2017 4.1  3.5 - 5.0 g/dL Final  . AST 12/23/2017 22  15 - 41 U/L Final  . ALT 12/23/2017 12* 14 - 54 U/L Final  . Alkaline Phosphatase 12/23/2017 69  38 - 126 U/L Final  . Total Bilirubin 12/23/2017 0.4  0.3 - 1.2 mg/dL Final  . GFR calc non Af Amer 12/23/2017 >60  >60 mL/min Final  . GFR calc Af Amer 12/23/2017 >60  >60 mL/min Final   Comment: (NOTE) The eGFR has been calculated using the CKD EPI equation. This calculation has not been validated in all clinical situations. eGFR's persistently <60 mL/min signify possible Chronic Kidney Disease.   Georgiann Hahn gap 12/23/2017 9  5 - 15 Final   Performed at [facility], [address] [place], [place] [address]  . Troponin I 12/23/2017 <0.03  <0.03 ng/mL Final   Performed at [facility], [address] [place], [place] [address]    Assessment:  Cheryl Diaz is a 82 y.o. female with stage I right breast cancer s/p lumpectomy followed by partial mastectomy.  Lumpectomy on 10/06/2010 revealed a 1.3 cm grade II invasive ductal carcinoma. 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 There was no lymphovascular invasion. Tumor was ER + (> 90%) PR + (> 90%) and HER-2/neu - by FISH.    She underwent right partial mastectomy (for margins) and sentinel lymph node biopsy on 10/21/2010.  There was no evidence of residual in situ or invasive carcinoma. There were fibrocystic changes and ductal hyperplasia.  Lymph node biopsy revealed no macrometastasis.  Pathologic stage was T1cN0.  Bilateral diagnostic mammogram on 02/09/2017 revealed no evidence of malignancy.  Bilateral screening mammogram on 04/03/2018 revealed possible asymmetry of the left breast.  Left diagnostic mammogram on 04/11/2018 revealed no evidence of malignancy.  Oncotype DX score was 15.  She was treated with the radiation.  She received  a Arimidex from 02/12/2011 - 05/15/2011. Arimidex was discontinued secondary to poor tolerance.  She began Tamoxifen on 05/15/2011.  BCI testing on 08/01/2015 revealed a 4.3% risk of late recurrence between years 5 and 10 (low risk category) with a high likelihood of benefit for extended adjuvant therapy.  CA27.29 has been followed: 38.7 on 02/24/2011, 27.4 on 09/10/2011, 32.8 on 03/10/2012, 30.8 on 07/24/2015, 31.6 on 03/16/2017, and 27.3 on 10/12/2017.  Symptomatically, she complains of a month long episode of bronchitis. She is seeing her PCP. She denies any breast concerns. Exam reveals post-operative changes.  Labs are unremarkable.   Plan: 1.  Labs today:  CBC with diff, CMP, CA27.29. 2.  Review interval bilateral screening mammogram and left sided diagnostic mammogram- no evidence of malignancy. 3.  Review interval chest CT angiogram.  Images personally reviewed- hiatal hernia. 4.  Continue tamoxifen. 5.  RTC in 6 months for MD assessment and lab s(CBC with diff, CMP, CA27.29).   Lequita Asal, MD  05/17/2018, 1:14 PM   I saw and evaluated the patient, participating in the key portions of the service and reviewing pertinent diagnostic studies and records.  I reviewed the nurse practitioner's note and agree with the findings and the plan.  The assessment and plan were discussed with the patient.  A few questions were asked by the patient and answered.   Nolon Stalls, MD 05/17/2018,1:14 PM

## 2018-05-31 ENCOUNTER — Inpatient Hospital Stay: Payer: Medicare Other | Attending: Hematology and Oncology | Admitting: Hematology and Oncology

## 2018-05-31 ENCOUNTER — Inpatient Hospital Stay: Payer: Medicare Other

## 2018-05-31 ENCOUNTER — Encounter: Payer: Self-pay | Admitting: Hematology and Oncology

## 2018-05-31 VITALS — BP 136/82 | HR 66 | Temp 94.2°F | Resp 18 | Wt 146.8 lb

## 2018-05-31 DIAGNOSIS — Z170 Estrogen receptor positive status [ER+]: Secondary | ICD-10-CM

## 2018-05-31 DIAGNOSIS — Z79810 Long term (current) use of selective estrogen receptor modulators (SERMs): Secondary | ICD-10-CM

## 2018-05-31 DIAGNOSIS — R351 Nocturia: Secondary | ICD-10-CM

## 2018-05-31 DIAGNOSIS — C50919 Malignant neoplasm of unspecified site of unspecified female breast: Secondary | ICD-10-CM

## 2018-05-31 DIAGNOSIS — C50911 Malignant neoplasm of unspecified site of right female breast: Secondary | ICD-10-CM

## 2018-05-31 LAB — URINALYSIS, COMPLETE (UACMP) WITH MICROSCOPIC
Bilirubin Urine: NEGATIVE
Glucose, UA: NEGATIVE mg/dL
Hgb urine dipstick: NEGATIVE
Ketones, ur: NEGATIVE mg/dL
Leukocytes, UA: NEGATIVE
Nitrite: NEGATIVE
Protein, ur: NEGATIVE mg/dL
Specific Gravity, Urine: 1.01 (ref 1.005–1.030)
pH: 5 (ref 5.0–8.0)

## 2018-05-31 LAB — CBC WITH DIFFERENTIAL/PLATELET
Basophils Absolute: 0 10*3/uL (ref 0–0.1)
Basophils Relative: 1 %
Eosinophils Absolute: 0.1 10*3/uL (ref 0–0.7)
Eosinophils Relative: 2 %
HCT: 37.9 % (ref 35.0–47.0)
Hemoglobin: 12.9 g/dL (ref 12.0–16.0)
Lymphocytes Relative: 29 %
Lymphs Abs: 1.5 10*3/uL (ref 1.0–3.6)
MCH: 33.6 pg (ref 26.0–34.0)
MCHC: 33.9 g/dL (ref 32.0–36.0)
MCV: 99 fL (ref 80.0–100.0)
Monocytes Absolute: 0.5 10*3/uL (ref 0.2–0.9)
Monocytes Relative: 10 %
Neutro Abs: 3 10*3/uL (ref 1.4–6.5)
Neutrophils Relative %: 58 %
Platelets: 159 10*3/uL (ref 150–440)
RBC: 3.83 MIL/uL (ref 3.80–5.20)
RDW: 13.2 % (ref 11.5–14.5)
WBC: 5.1 10*3/uL (ref 3.6–11.0)

## 2018-05-31 LAB — COMPREHENSIVE METABOLIC PANEL
ALT: 13 U/L (ref 0–44)
AST: 21 U/L (ref 15–41)
Albumin: 4 g/dL (ref 3.5–5.0)
Alkaline Phosphatase: 56 U/L (ref 38–126)
Anion gap: 8 (ref 5–15)
BUN: 15 mg/dL (ref 8–23)
CO2: 24 mmol/L (ref 22–32)
Calcium: 8.5 mg/dL — ABNORMAL LOW (ref 8.9–10.3)
Chloride: 110 mmol/L (ref 98–111)
Creatinine, Ser: 0.74 mg/dL (ref 0.44–1.00)
GFR calc Af Amer: >60 mL/min (ref >60)
GFR calc non Af Amer: >60 mL/min (ref >60)
Glucose, Bld: 139 mg/dL — ABNORMAL HIGH (ref 70–99)
Potassium: 3.8 mmol/L (ref 3.5–5.1)
Sodium: 142 mmol/L (ref 135–145)
Total Bilirubin: 0.5 mg/dL (ref 0.3–1.2)
Total Protein: 6.5 g/dL (ref 6.5–8.1)

## 2018-05-31 NOTE — Patient Instructions (Signed)
Please start Calcium 1200 mg and vitamin D 800 IU daily. You can get the combination preparation, but will have to take 2 of these pills as they generally come in 600 mg/400 IU tablets.   If you have questions, ask your pharmacist. You can also call us here at the clinic.   Respectfully, Honor Loh, MSN, APRN, FNP-C, CEN Oncology/Hematology Nurse Practitioner  [facility]  Hypocalcemia, Adult Hypocalcemia is when the level of calcium in a person's blood is below normal. Calcium is a mineral that is used by the body in many ways. A lack of blood calcium can affect the heart and muscles, make the bones more likely to break, and cause other problems. What are the causes? This condition may be caused by:  Decreased production (hypoparathyroidism) or improper use of parathyroid hormone.  Problems with the parathyroid glands or surgical removal of these glands.  Problems with parathyroid function after removal of the thyroid gland.  Lack (deficiency) of vitamin D or magnesium or both.  Kidney problems.  Less common causes include:  Intestinal problems that interfere with nutrient absorption.  Alcoholism.  Low levels of a body protein that is called albumin.  Inflammation of the pancreas (pancreatitis).  Certain medicines.  Severe infections (sepsis).  Certain diseases, such as sarcoidosis or hemochromatosis, that cause the parathyroid glands to be filled with cells or substances that are not normally present.  Breakdown of large amounts of muscle fiber.  High levels of phosphate in the body.  Cancer.  Massive blood transfusions, which usually occur with severe trauma.  What are the signs or symptoms? Symptoms of this condition include:  Numbness and tingling in the fingers, toes, or around the mouth.  Muscle aches or cramps, especially in the legs, feet, and back.  Muscle twitches.  Abdominal cramping or pain.  Memory problems,  confusion, or difficulty thinking.  Depression, anxiety, irritability, or changes in personality.  Fainting.  Chest pain.  Difficulty swallowing.  Changes in the sound of the voice.  Shortness of breath or wheezing.  General weakness and fatigue.  Symptoms of severe hypocalcemia include:  Shaking uncontrollably (seizures).  Seizure of the voice box (laryngospasm).  Fast heartbeats (palpitations) and abnormal heart rhythms (arrhythmias).  Long-term symptoms of this condition include:  Coarse, brittle hair and nails.  Dry skin or lasting (chronic) skin diseases (psoriasis, eczema,, or dermatitis).  Clouding of the eye lens (cataracts).  How is this diagnosed? This condition is usually diagnosed with a blood test. You may also have other tests to help determine the underlying cause of the condition. For example, a test may be done that records the electrical activity of the heart (electrocardiogram,or ECG). How is this treated? Treatment for this condition may include:  Calcium given by mouth (orally) or given through an IV tube that is inserted into one of your veins. The method used for giving calcium will depend on the severity of the condition.  Other minerals (electrolytes), such as magnesium.  Other treatment will depend on the cause of the condition. Follow these instructions at home:  Follow diet instructions from your health care provider or dietitian.  Take supplements only as told by your health care provider.  Keep all follow-up visits as told by your health care provider. This is important. Contact a health care provider if:  You have increased fatigue.  You have increased muscle twitching.  You have new swelling in the feet, ankles, or legs.  You develop changes in  mood, memory, or personality. Get help right away if:  You have chest pain.  You have persistent rapid or irregular heartbeats.  You have difficulty breathing.  You faint.  You  start to have seizures.  You have confusion. This information is not intended to replace advice given to you by your health care provider. Make sure you discuss any questions you have with your health care provider. Document Released: 03/17/2010 Document Revised: 03/04/2016 Document Reviewed: 02/12/2015 Elsevier Interactive Patient Education  [employer].

## 2018-05-31 NOTE — Progress Notes (Signed)
Patient states she has noticed she is getting red spots on her skin that later turns brown.  She has noticed them in the last  Few months.  Patient states she is up a lot during the night to urinate.  Otherwise, offers no complaints.

## 2018-05-31 NOTE — Progress Notes (Signed)
[facility] day:  05/31/2018  Chief Complaint: Cheryl Diaz is a 82 y.o. female with stage I right breast cancer who is seen for 6 month assessment on tamoxifen.   HPI:  The patient was last seen in the medical oncology clinic on 10/12/2017.  At that time, she complained of a month long episode of bronchitis. She was seeing her PCP. She denied any breast concerns. Exam revealed post-operative changes.  Labs were unremarkable. CA27.29 was normal.  She was seen in the [facility] ER on 12/23/2017.  She described several weeks of aching in her chest and right sided thoracic back pain.  D-dimer was elevated.  Chest CT angiogram on 12/23/2017 revealed no evidence of pulmonary embolism.  There was a moderate hiatal hernia.  There was cholelithiasis with an unremarkable gallbladder.  Barium swallow on 12/29/2017 revealed a normal appearing hypopharynx and cervical esophagus.  There was a mild short-segment distal esophageal stricture just above a reducible hiatal hernia. This resulted in transient hang up of the 13 mm barium tablet but no obstruction to the liquid barium.  There were no mucosal changes to suggest esophagitis.  Bilateral screening mammogram on 04/03/2018 revealed possible asymmetry of the left breast.  Left diagnostic mammogram on 04/11/2018 revealed no evidence of malignancy.  Screening mammogram was recommended in 1 year.  During the interim, patient is "still making it". She notes that she has issues with a bad LEFT eye. Patient describes an "accident" that happened during a routine cataract extraction. She subsequently has required consultation and treatment with neurosurgery. She has been seen 3 times.   Patient denies that she has experienced any B symptoms. She denies any interval infections.  Patient does not verbalize any concerns with regards to her breasts today. Patient performs self breast examinations only "once in a while".  Patient continues on her tamoxifen as previously prescribed with no perceived side effects. Patient is having increased nocturia. She denies dysuria, urgency, and frequency.   Patient advises that she maintains an adequate appetite. She is eating well. Weight today is 146 lb 13.2 oz (66.6 kg), which compared to her last visit to the clinic, represents a 1 pound loss.   Patient denies pain in the clinic today.   Past Medical History:  Diagnosis Date  . Anemia   . Arthritis   . Breast cancer ([place]) 10/2010   ER/PR+, Her2neu- RT LUMPECTOMY  . GERD (gastroesophageal reflux disease)   . History of hiatal hernia   . Personal history of radiation therapy   . Radiation 2011   RIGHT BREAST CA    Past Surgical History:  Procedure Laterality Date  . BREAST BIOPSY Right 2012   +  . BREAST LUMPECTOMY Right 2012  . CARPAL TUNNEL RELEASE    . CATARACT EXTRACTION W/PHACO Left 01/12/2018   Procedure: CATARACT EXTRACTION PHACO AND INTRAOCULAR LENS PLACEMENT (IOC) suture placed in left eye at end of proedure no lens inserted anterior vitrectomy;  Surgeon: Eulogio Bear, MD;  Location: [facility] ORS;  Service: Ophthalmology;  Laterality: Left;  [place]  AP% CDE Fluid pack lot # [id number]  . FINGER SURGERY     I&D    Family History  Problem Relation Age of Onset  . Breast cancer Neg Hx     Social History:  reports that she has quit smoking. She has never used smokeless tobacco. She reports that she drank alcohol. She reports that she does not use drugs.  She lives  in [place].  Her husband is handicapped and is almost totally blind.  The patient lives in [place].  The patient is alone today.  Allergies:  Allergies  Allergen Reactions  . Diclofenac Other (See Comments)  . Penicillin G Other (See Comments)    lumps and swelling, hives  . Cephalosporins Rash    Current Medications: Current Outpatient Medications  Medication Sig Dispense Refill  . acetaminophen (TYLENOL) 325 MG tablet Take 650 mg by  mouth every 6 (six) hours as needed.    . cyanocobalamin (,VITAMIN B-12,) 1000 MCG/ML injection Inject 1,000 mcg into the muscle every 30 (thirty) days.    Marland Kitchen loratadine (CLARITIN) 10 MG tablet Take 10 mg by mouth daily as needed for allergies.    . tamoxifen (NOLVADEX) 20 MG tablet TAKE 1 TABLET AT BEDTIME. DO NOT TAKE IF YOU ARE PREGNANT. DO NOT SKIP OR STOP A DOSE. TAKE EXACTLY AS PRESCRIBED 270 tablet 2  . Fluticasone-Salmeterol (ADVAIR DISKUS) 250-50 MCG/DOSE AEPB Inhale into the lungs every 12 (twelve) hours as needed.    . naproxen sodium (RA NAPROXEN SODIUM) 220 MG tablet Take by mouth.     No current facility-administered medications for this visit.     Review of Systems  Constitutional: Positive for weight loss (down 1 pound). Negative for diaphoresis, fever and malaise/fatigue.       "I'm still making it".   HENT: Negative.  Negative for congestion, ear pain, nosebleeds, sinus pain and sore throat.   Eyes: Positive for blurred vision (LEFT eye vision changes related to sequela associated with cataract surgery). Negative for pain and redness.  Respiratory: Negative for cough, hemoptysis, sputum production and shortness of breath.   Cardiovascular: Negative for chest pain, palpitations, orthopnea, leg swelling and PND.  Gastrointestinal: Negative for abdominal pain, blood in stool, constipation, diarrhea, melena, nausea and vomiting.  Genitourinary: Negative for dysuria, frequency, hematuria and urgency.       Nocturia  Musculoskeletal: Positive for joint pain (thumbs (R>L)). Negative for back pain, falls and myalgias.       Scoliosis  Skin: Negative for itching and rash.  Neurological: Negative for dizziness, tremors, weakness and headaches.  Endo/Heme/Allergies: Does not bruise/bleed easily.  Psychiatric/Behavioral: Positive for memory loss. Negative for depression and suicidal ideas. The patient is not nervous/anxious and does not have insomnia.   All other systems reviewed and  are negative.  Performance status (ECOG): 1 - Symptomatic but completely ambulatory  Vital Signs BP 136/82 (BP Location: Left Arm, Patient Position: Sitting)   Pulse 66   Temp (!) 94.2 F (34.6 C) (Tympanic)   Resp 18   Wt 146 lb 13.2 oz (66.6 kg)   BMI 26.85 kg/m   Physical Exam  Constitutional: She is oriented to person, place, and time and well-developed, well-nourished, and in no distress.  HENT:  Head: Normocephalic and atraumatic.  Short blonde/grey hair  Eyes: Pupils are equal, round, and reactive to light. Conjunctivae and EOM are normal. No scleral icterus.  Glasses. Blue eyes.   Neck: Normal range of motion. Neck supple. No JVD present.  Cardiovascular: Normal rate, regular rhythm and normal heart sounds. Exam reveals no gallop and no friction rub.  No murmur heard. Pulmonary/Chest: Effort normal and breath sounds normal. No respiratory distress. She has no wheezes. She has no rales. Right breast exhibits inverted nipple and skin change (post operative and post radiation changes between 11 and 2 o'clock position. Semi-circular incision.  Stable inferior edema. ). Right breast exhibits no mass and  no nipple discharge. Left breast exhibits skin change (fibrocyctic changes in the outer quadrants). Left breast exhibits no inverted nipple, no mass and no nipple discharge.  Abdominal: Soft. Bowel sounds are normal. She exhibits no distension and no mass. There is no tenderness. There is no rebound and no guarding.  Musculoskeletal: Normal range of motion. She exhibits no edema or tenderness.  Lymphadenopathy:    She has no cervical adenopathy.    She has no axillary adenopathy.       Right: No inguinal and no supraclavicular adenopathy present.       Left: No inguinal and no supraclavicular adenopathy present.  Neurological: She is alert and oriented to person, place, and time.  Skin: Skin is warm and dry. No rash noted. No erythema.  Psychiatric: Mood, affect and judgment  normal.  Nursing note and vitals reviewed.   Appointment on 05/31/2018  Component Date Value Ref Range Status  . Sodium 05/31/2018 142  135 - 145 mmol/L Final  . Potassium 05/31/2018 3.8  3.5 - 5.1 mmol/L Final  . Chloride 05/31/2018 110  98 - 111 mmol/L Final  . CO2 05/31/2018 24  22 - 32 mmol/L Final  . Glucose, Bld 05/31/2018 139* 70 - 99 mg/dL Final  . BUN 05/31/2018 15  8 - 23 mg/dL Final  . Creatinine, Ser 05/31/2018 0.74  0.44 - 1.00 mg/dL Final  . Calcium 05/31/2018 8.5* 8.9 - 10.3 mg/dL Final  . Total Protein 05/31/2018 6.5  6.5 - 8.1 g/dL Final  . Albumin 05/31/2018 4.0  3.5 - 5.0 g/dL Final  . AST 05/31/2018 21  15 - 41 U/L Final  . ALT 05/31/2018 13  0 - 44 U/L Final  . Alkaline Phosphatase 05/31/2018 56  38 - 126 U/L Final  . Total Bilirubin 05/31/2018 0.5  0.3 - 1.2 mg/dL Final  . GFR calc non Af Amer 05/31/2018 >60  >60 mL/min Final  . GFR calc Af Amer 05/31/2018 >60  >60 mL/min Final   Comment: (NOTE) The eGFR has been calculated using the CKD EPI equation. This calculation has not been validated in all clinical situations. eGFR's persistently <60 mL/min signify possible Chronic Kidney Disease.   Georgiann Hahn gap 05/31/2018 8  5 - 15 Final   Performed at [facility], [address] [place], [place] [address]  . WBC 05/31/2018 5.1  3.6 - 11.0 K/uL Final  . RBC 05/31/2018 3.83  3.80 - 5.20 MIL/uL Final  . Hemoglobin 05/31/2018 12.9  12.0 - 16.0 g/dL Final  . HCT 05/31/2018 37.9  35.0 - 47.0 % Final  . MCV 05/31/2018 99.0  80.0 - 100.0 fL Final  . MCH 05/31/2018 33.6  26.0 - 34.0 pg Final  . MCHC 05/31/2018 33.9  32.0 - 36.0 g/dL Final  . RDW 05/31/2018 13.2  11.5 - 14.5 % Final  . Platelets 05/31/2018 159  150 - 440 K/uL Final  . Neutrophils Relative % 05/31/2018 58  % Final  . Neutro Abs 05/31/2018 3.0  1.4 - 6.5 K/uL Final  . Lymphocytes Relative 05/31/2018 29  % Final  . Lymphs Abs 05/31/2018 1.5  1.0 - 3.6 K/uL Final  . Monocytes Relative  05/31/2018 10  % Final  . Monocytes Absolute 05/31/2018 0.5  0.2 - 0.9 K/uL Final  . Eosinophils Relative 05/31/2018 2  % Final  . Eosinophils Absolute 05/31/2018 0.1  0 - 0.7 K/uL Final  . Basophils Relative 05/31/2018 1  % Final  . Basophils Absolute 05/31/2018  0.0  0 - 0.1 K/uL Final   Performed at [facility], [address], [place], [place] [address]  . Color, Urine 05/31/2018 YELLOW  YELLOW Final  . APPearance 05/31/2018 CLEAR  CLEAR Final  . Specific Gravity, Urine 05/31/2018 1.010  1.005 - 1.030 Final  . pH 05/31/2018 5.0  5.0 - 8.0 Final  . Glucose, UA 05/31/2018 NEGATIVE  NEGATIVE mg/dL Final  . Hgb urine dipstick 05/31/2018 NEGATIVE  NEGATIVE Final  . Bilirubin Urine 05/31/2018 NEGATIVE  NEGATIVE Final  . Ketones, ur 05/31/2018 NEGATIVE  NEGATIVE mg/dL Final  . Protein, ur 05/31/2018 NEGATIVE  NEGATIVE mg/dL Final  . Nitrite 05/31/2018 NEGATIVE  NEGATIVE Final  . Leukocytes, UA 05/31/2018 NEGATIVE  NEGATIVE Final  . Squamous Epithelial / LPF 05/31/2018 0-5  0 - 5 Final  . WBC, UA 05/31/2018 0-5  0 - 5 WBC/hpf Final  . RBC / HPF 05/31/2018 0-5  0 - 5 RBC/hpf Final  . Bacteria, UA 05/31/2018 RARE* NONE SEEN Final   Performed at [facility], [address], [place], [place] [address]    Assessment:  Gwendolin Briel is a 82 y.o. female with stage I right breast cancer s/p lumpectomy followed by partial mastectomy.  Lumpectomy on 10/06/2010 revealed a 1.3 cm grade II invasive ductal carcinoma. 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 There was no lymphovascular invasion. Tumor was ER + (> 90%) PR + (> 90%) and HER-2/neu - by FISH.    She underwent right partial mastectomy (for margins) and sentinel lymph node biopsy on 10/21/2010.  There was no evidence of residual in situ or invasive carcinoma. There were fibrocystic changes and ductal hyperplasia.  Lymph node biopsy revealed no  macrometastasis.  Pathologic stage was T1cN0.  Bilateral diagnostic mammogram on 02/09/2017 revealed no evidence of malignancy. Bilateral screening mammogram on 04/03/2018 revealed possible asymmetry of the left breast.  Left diagnostic mammogram on 04/11/2018 revealed no evidence of malignancy.  Oncotype DX score was 15.  She was treated with the radiation.  She received a Arimidex from 02/12/2011 - 05/15/2011. Arimidex was discontinued secondary to poor tolerance.  She began Tamoxifen on 05/15/2011.  BCI testing on 08/01/2015 revealed a 4.3% risk of late recurrence between years 5 and 10 (low risk category) with a high likelihood of benefit for extended adjuvant therapy.  CA27.29 has been followed: 38.7 on 02/24/2011, 27.4 on 09/10/2011, 32.8 on 03/10/2012, 30.8 on 07/24/2015, 31.6 on 03/16/2017, 27.3 on 10/12/2017, and 27 on 05/31/2018.  Symptomatically, patient is doing well overall. She complains of changes in the vision in her LEFT eye following cataract surgery. She has increasing nocturia, however denies dysuria. No B symptoms or interval infections. No breast concerns. Labs unremarkable.   Plan: 1. Labs today:  CBC with diff, CMP, CA27.29. 2. Breast cancer - stable  Doing well. No breast concerns. CA27.29 is normal.  Diagnostic LEFT mammogram on 04/11/2018 revaled no evidence of malignancy. SCREENING mammogram recommended in 1 year.   Continues on tamoxifen with minimal side effects. Will continue as previously prescribed.  3. HYPOcalcemia - acute  Discuss low calcium of 8.5. She does not take daily supplementation.   Encouraged patient to start calcium 1200 mg and vitamin D 800 IU daily.  4. Nocturia - increasing  Will check UA today in clinic as patient notes that she is having increased urinary frequency at night.  5. Chest pain - resolved  Discuss interval  events that precipitated patient being seen in the ED.   Interval CT imaging of the chest done in the ED on  12/23/2017. Imaging personally reviewed and findings felt to be consisted with dictated radiology report. Imaging (+) for hiatal hernia.   6. RTC in 6 months for MD assessment and labs (CBC with diff, CMP, CA27.29)   Honor Loh, NP  05/31/2018, 1:04 PM   I saw and evaluated the patient, participating in the key portions of the service and reviewing pertinent diagnostic studies and records.  I reviewed the nurse practitioner's note and agree with the findings and the plan.  The assessment and plan were discussed with the patient.  Several questions were asked by the patient and answered.   Nolon Stalls, MD 05/31/2018,1:04 PM

## 2018-06-01 LAB — CA 27.29 (SERIAL MONITOR): CA 27.29: 27 U/mL (ref 0.0–38.6)

## 2018-11-29 ENCOUNTER — Inpatient Hospital Stay: Payer: Medicare Other | Attending: Hematology and Oncology | Admitting: Hematology and Oncology

## 2018-11-29 ENCOUNTER — Encounter: Payer: Self-pay | Admitting: Hematology and Oncology

## 2018-11-29 ENCOUNTER — Inpatient Hospital Stay: Payer: Medicare Other

## 2018-11-29 VITALS — BP 158/84 | HR 79 | Temp 98.0°F | Resp 18 | Ht 62.0 in | Wt 149.5 lb

## 2018-11-29 DIAGNOSIS — Z9011 Acquired absence of right breast and nipple: Secondary | ICD-10-CM

## 2018-11-29 DIAGNOSIS — C50911 Malignant neoplasm of unspecified site of right female breast: Secondary | ICD-10-CM | POA: Diagnosis not present

## 2018-11-29 DIAGNOSIS — N898 Other specified noninflammatory disorders of vagina: Secondary | ICD-10-CM | POA: Insufficient documentation

## 2018-11-29 DIAGNOSIS — Z79810 Long term (current) use of selective estrogen receptor modulators (SERMs): Secondary | ICD-10-CM | POA: Insufficient documentation

## 2018-11-29 DIAGNOSIS — C50919 Malignant neoplasm of unspecified site of unspecified female breast: Secondary | ICD-10-CM

## 2018-11-29 DIAGNOSIS — Z170 Estrogen receptor positive status [ER+]: Secondary | ICD-10-CM | POA: Diagnosis not present

## 2018-11-29 DIAGNOSIS — Z87891 Personal history of nicotine dependence: Secondary | ICD-10-CM

## 2018-11-29 LAB — COMPREHENSIVE METABOLIC PANEL
ALT: 12 U/L (ref 0–44)
AST: 24 U/L (ref 15–41)
Albumin: 3.9 g/dL (ref 3.5–5.0)
Alkaline Phosphatase: 50 U/L (ref 38–126)
Anion gap: 10 (ref 5–15)
BUN: 15 mg/dL (ref 8–23)
CO2: 24 mmol/L (ref 22–32)
Calcium: 8.8 mg/dL — ABNORMAL LOW (ref 8.9–10.3)
Chloride: 107 mmol/L (ref 98–111)
Creatinine, Ser: 0.83 mg/dL (ref 0.44–1.00)
GFR calc Af Amer: >60 mL/min (ref >60)
GFR calc non Af Amer: >60 mL/min (ref >60)
Glucose, Bld: 191 mg/dL — ABNORMAL HIGH (ref 70–99)
Potassium: 3.7 mmol/L (ref 3.5–5.1)
Sodium: 141 mmol/L (ref 135–145)
Total Bilirubin: 0.8 mg/dL (ref 0.3–1.2)
Total Protein: 6.6 g/dL (ref 6.5–8.1)

## 2018-11-29 LAB — CBC WITH DIFFERENTIAL/PLATELET
Abs Immature Granulocytes: 0.01 10*3/uL (ref 0.00–0.07)
Basophils Absolute: 0 10*3/uL (ref 0.0–0.1)
Basophils Relative: 1 %
Eosinophils Absolute: 0.1 10*3/uL (ref 0.0–0.5)
Eosinophils Relative: 1 %
HCT: 39.6 % (ref 36.0–46.0)
Hemoglobin: 13.5 g/dL (ref 12.0–15.0)
Immature Granulocytes: 0 %
Lymphocytes Relative: 35 %
Lymphs Abs: 2 10*3/uL (ref 0.7–4.0)
MCH: 33.8 pg (ref 26.0–34.0)
MCHC: 34.1 g/dL (ref 30.0–36.0)
MCV: 99.2 fL (ref 80.0–100.0)
Monocytes Absolute: 0.5 10*3/uL (ref 0.1–1.0)
Monocytes Relative: 8 %
Neutro Abs: 3.1 10*3/uL (ref 1.7–7.7)
Neutrophils Relative %: 55 %
Platelets: 159 10*3/uL (ref 150–400)
RBC: 3.99 MIL/uL (ref 3.87–5.11)
RDW: 12.6 % (ref 11.5–15.5)
WBC: 5.8 10*3/uL (ref 4.0–10.5)
nRBC: 0 % (ref 0.0–0.2)

## 2018-11-29 NOTE — Progress Notes (Signed)
[facility] day:  11/29/2018  Chief Complaint: Cheryl Diaz is a 83 y.o. female with stage I right breast cancer who is seen for 6 month assessment on tamoxifen.   HPI:  The patient was last seen in the medical oncology clinic on 05/31/2018.  At that time, she was doing well overall. She complained of changes in the vision in her LEFT eye following cataract surgery. She had increasing nocturia, however denied dysuria. No B symptoms or interval infections. No breast concerns. Labs were unremarkable.   During the interim, patient notes that "yellow has been coming out of her" for the last 2 weeks. Patient unsure if it is coming from her vagina or rectum. She wears pads and notes "yellow stains".  She sees Dr. Ouida Sills (PCP).  Despite being on tamoxifen, and previous education regarding the need for annual pelvic exams, patient unable to recall her last pelvic exam. Patient denies any associated pain or bleeding.   She continues to complain of issues with her LEFT eye following cataract surgery. She states, "They were taking my cataract out and dropped it down deeper in my eye. They could not reach it.". Patient had initial surgery with [facility].  Four days later, patient had to undergo a second procedure at [facility]. Patient states, "I have to use drops in my eyes everyday now". Over the last 2 weeks, vision has started to improve "some".   Patient was advised to restart calcium therapy during her last visit. Patient states, "Every since I have been dealing with this stuff with my eye, I have been foggy and can't remember anything". She sees her PCP on an every 6 month basis.  Patient complaining of vertigo symptoms for the "last little while" (4 weeks). Symptoms worse when the patient is in a supine position.  Patient denies that she has experienced any B symptoms. She denies any interval infections. Patient does not verbalize any concerns with  regards to her breasts today. Patient performs monthly self breast examinations as recommended.   Patient advises that she maintains an adequate appetite. She is eating well. Weight today is 149 lb 7.6 oz (67.8 kg), which compared to her last visit to the clinic, represents a  3 pound increase.  Patient denies pain in the clinic today.   Past Medical History:  Diagnosis Date  . Anemia   . Arthritis   . Breast cancer ([place]) 10/2010   ER/PR+, Her2neu- RT LUMPECTOMY  . GERD (gastroesophageal reflux disease)   . History of hiatal hernia   . Personal history of radiation therapy   . Radiation 2011   RIGHT BREAST CA    Past Surgical History:  Procedure Laterality Date  . BREAST BIOPSY Right 2012   +  . BREAST LUMPECTOMY Right 2012  . CARPAL TUNNEL RELEASE    . CATARACT EXTRACTION W/PHACO Left 01/12/2018   Procedure: CATARACT EXTRACTION PHACO AND INTRAOCULAR LENS PLACEMENT (IOC) suture placed in left eye at end of proedure no lens inserted anterior vitrectomy;  Surgeon: Eulogio Bear, MD;  Location: [facility] ORS;  Service: Ophthalmology;  Laterality: Left;  [place]  AP% CDE Fluid pack lot # [id number]  . FINGER SURGERY     I&D    Family History  Problem Relation Age of Onset  . Breast cancer Neg Hx     Social History:  reports that she has quit smoking. She has never used smokeless tobacco. She reports previous alcohol use. She  reports that she does not use drugs.  She lives in [place].  Her husband is handicapped and is almost totally blind.  The patient lives in [place].  The patient is alone today.  Allergies:  Allergies  Allergen Reactions  . Diclofenac Other (See Comments)  . Penicillin G Other (See Comments)    lumps and swelling, hives  . Cephalosporins Rash    Current Medications: Current Outpatient Medications  Medication Sig Dispense Refill  . cyanocobalamin (,VITAMIN B-12,) 1000 MCG/ML injection Inject 1,000 mcg into the muscle every 30 (thirty) days.    Marland Kitchen loratadine  (CLARITIN) 10 MG tablet Take 10 mg by mouth daily as needed for allergies.    Marland Kitchen moxifloxacin (VIGAMOX) 0.5 % ophthalmic solution Administer 1 drop into the left eye Four (4) times a day.    . prednisoLONE acetate (PRED FORTE) 1 % ophthalmic suspension Administer 1 drop into the left eye 6XD.    Marland Kitchen tamoxifen (NOLVADEX) 20 MG tablet TAKE 1 TABLET AT BEDTIME. DO NOT TAKE IF YOU ARE PREGNANT. DO NOT SKIP OR STOP A DOSE. TAKE EXACTLY AS PRESCRIBED 270 tablet 2  . acetaminophen (TYLENOL) 325 MG tablet Take 650 mg by mouth every 6 (six) hours as needed.    . Fluticasone-Salmeterol (ADVAIR DISKUS) 250-50 MCG/DOSE AEPB Inhale into the lungs every 12 (twelve) hours as needed.    . naproxen sodium (RA NAPROXEN SODIUM) 220 MG tablet Take by mouth.     No current facility-administered medications for this visit.     Review of Systems  Constitutional: Negative for chills, diaphoresis, fever, malaise/fatigue and weight loss (up 3 pounds).  HENT: Negative.  Negative for congestion, ear discharge, ear pain, nosebleeds, sinus pain, sore throat and tinnitus.   Eyes: Positive for blurred vision (LEFT eye vision changes related to sequela s/p cataract surgery). Negative for double vision.  Respiratory: Negative.  Negative for cough, hemoptysis, sputum production and shortness of breath.   Cardiovascular: Negative.  Negative for chest pain, palpitations, orthopnea, leg swelling and PND.  Gastrointestinal: Negative.  Negative for abdominal pain, blood in stool, constipation, diarrhea, heartburn, melena, nausea and vomiting.  Genitourinary: Negative for dysuria, frequency, hematuria and urgency.       Yellow stains on pads (? vagina or rectum).  Unknown last pelvic exam.  Musculoskeletal: Positive for joint pain (thumbs (R>L)). Negative for back pain, falls and myalgias.       Scoliosis.  Skin: Negative.  Negative for itching and rash.  Neurological: Negative for tingling, tremors, sensory change, speech change, focal  weakness, weakness and headaches.       Vertigo x 4 weeks.  Endo/Heme/Allergies: Negative.  Does not bruise/bleed easily.  Psychiatric/Behavioral: Positive for memory loss. Negative for depression. The patient is not nervous/anxious and does not have insomnia.   All other systems reviewed and are negative.  Performance status (ECOG): 1  Vital Signs Pulse 79   Temp 98 F (36.7 C)   Resp 18   Ht '5\' 2"'$  (1.575 m)   Wt 149 lb 7.6 oz (67.8 kg)   BMI 27.34 kg/m   Physical Exam  Constitutional: She is oriented to person, place, and time and well-developed, well-nourished, and in no distress. No distress.  HENT:  Head: Normocephalic and atraumatic.  Mouth/Throat: No oropharyngeal exudate.  Shoulder length graying hair.  Eyes: Pupils are equal, round, and reactive to light. Conjunctivae and EOM are normal. No scleral icterus.  Glasses.  Blue eyes.   Neck: Normal range of motion. Neck supple. No  JVD present.  Cardiovascular: Normal rate, regular rhythm and normal heart sounds. Exam reveals no gallop and no friction rub.  No murmur heard. Pulmonary/Chest: Effort normal and breath sounds normal. No respiratory distress. She has no wheezes. She has no rales. Right breast exhibits inverted nipple. Right breast exhibits no mass, no nipple discharge and no skin change (post operative and post radiation changes. Semi-circular incision.  Stable inferior edema. ). Left breast exhibits no inverted nipple, no mass, no nipple discharge and no skin change (fibrocyctic changes superiorly).  Abdominal: Soft. Bowel sounds are normal. She exhibits no distension and no mass. There is no abdominal tenderness. There is no rebound and no guarding.  Musculoskeletal: Normal range of motion.        General: No tenderness or edema.  Lymphadenopathy:    She has no cervical adenopathy.    She has no axillary adenopathy.       Right: No inguinal and no supraclavicular adenopathy present.       Left: No inguinal and no  supraclavicular adenopathy present.  Neurological: She is alert and oriented to person, place, and time. Gait normal.  Skin: Skin is warm and dry. No rash noted. She is not diaphoretic. No erythema. No pallor.  Psychiatric: Mood, affect and judgment normal.  Nursing note and vitals reviewed.   Appointment on 11/29/2018  Component Date Value Ref Range Status  . Sodium 11/29/2018 141  135 - 145 mmol/L Final  . Potassium 11/29/2018 3.7  3.5 - 5.1 mmol/L Final  . Chloride 11/29/2018 107  98 - 111 mmol/L Final  . CO2 11/29/2018 24  22 - 32 mmol/L Final  . Glucose, Bld 11/29/2018 191* 70 - 99 mg/dL Final  . BUN 11/29/2018 15  8 - 23 mg/dL Final  . Creatinine, Ser 11/29/2018 0.83  0.44 - 1.00 mg/dL Final  . Calcium 11/29/2018 8.8* 8.9 - 10.3 mg/dL Final  . Total Protein 11/29/2018 6.6  6.5 - 8.1 g/dL Final  . Albumin 11/29/2018 3.9  3.5 - 5.0 g/dL Final  . AST 11/29/2018 24  15 - 41 U/L Final  . ALT 11/29/2018 12  0 - 44 U/L Final  . Alkaline Phosphatase 11/29/2018 50  38 - 126 U/L Final  . Total Bilirubin 11/29/2018 0.8  0.3 - 1.2 mg/dL Final  . GFR calc non Af Amer 11/29/2018 >60  >60 mL/min Final  . GFR calc Af Amer 11/29/2018 >60  >60 mL/min Final  . Anion gap 11/29/2018 10  5 - 15 Final   Performed at [facility], [address] [place],  [address]  . WBC 11/29/2018 5.8  4.0 - 10.5 K/uL Final  . RBC 11/29/2018 3.99  3.87 - 5.11 MIL/uL Final  . Hemoglobin 11/29/2018 13.5  12.0 - 15.0 g/dL Final  . HCT 11/29/2018 39.6  36.0 - 46.0 % Final  . MCV 11/29/2018 99.2  80.0 - 100.0 fL Final  . MCH 11/29/2018 33.8  26.0 - 34.0 pg Final  . MCHC 11/29/2018 34.1  30.0 - 36.0 g/dL Final  . RDW 11/29/2018 12.6  11.5 - 15.5 % Final  . Platelets 11/29/2018 159  150 - 400 K/uL Final  . nRBC 11/29/2018 0.0  0.0 - 0.2 % Final  . Neutrophils Relative % 11/29/2018 55  % Final  . Neutro Abs 11/29/2018 3.1  1.7 - 7.7 K/uL Final  . Lymphocytes Relative 11/29/2018 35  % Final  .  Lymphs Abs 11/29/2018 2.0  0.7 - 4.0 K/uL Final  .  Monocytes Relative 11/29/2018 8  % Final  . Monocytes Absolute 11/29/2018 0.5  0.1 - 1.0 K/uL Final  . Eosinophils Relative 11/29/2018 1  % Final  . Eosinophils Absolute 11/29/2018 0.1  0.0 - 0.5 K/uL Final  . Basophils Relative 11/29/2018 1  % Final  . Basophils Absolute 11/29/2018 0.0  0.0 - 0.1 K/uL Final  . Immature Granulocytes 11/29/2018 0  % Final  . Abs Immature Granulocytes 11/29/2018 0.01  0.00 - 0.07 K/uL Final   Performed at [facility], [address] [place],  [address]    Assessment:  Icel Castles is a 83 y.o. female with stage I right breast cancer s/p lumpectomy followed by partial mastectomy.  Lumpectomy on 10/06/2010 revealed a 1.3 cm grade II invasive ductal carcinoma. 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 There was no lymphovascular invasion. Tumor was ER + (> 90%) PR + (> 90%) and HER-2/neu - by FISH.    She underwent right partial mastectomy (for margins) and sentinel lymph node biopsy on 10/21/2010.  There was no evidence of residual in situ or invasive carcinoma. There were fibrocystic changes and ductal hyperplasia.  Lymph node biopsy revealed no macrometastasis.  Pathologic stage was T1cN0.  Bilateral screening mammogram on 04/03/2018 revealed possible asymmetry of the left breast.  Left diagnostic mammogram on 04/11/2018 revealed no evidence of malignancy.  Oncotype DX score was 15.  She was treated with the radiation.  She received a Arimidex from 02/12/2011 - 05/15/2011. Arimidex was discontinued secondary to poor tolerance.  She began Tamoxifen on 05/15/2011.  BCI testing on 08/01/2015 revealed a 4.3% risk of late recurrence between years 5 and 10 (low risk category) with a high likelihood of benefit for extended adjuvant therapy.  CA27.29 has been followed: 38.7 on 02/24/2011, 27.4 on 09/10/2011, 32.8 on 03/10/2012,  30.8 on 07/24/2015, 31.6 on 03/16/2017, 27.3 on 10/12/2017, 27 on 05/31/2018, and 28.6 on 11/29/2018.  Symptomatically, she has ongoing left eye issues.  She notes some questionable vaginal discharge.  Exam is stable.  Plan: 1. Labs today:  CBC with diff, CMP, CA27.29. 2.   Stage I right breast cancer  Clinically doing well.  No evidence of locally recurrent disease. CA27.29 is 28.6 (normal). Continue tamoxifen. Schedule mammogram on 04/13/2019. 3.   HYPOcalcemia - acute Calcium 8.8 (slightly low). Encourage calcium and vitamin D. 4.   Questionable vaginal discharge  Patient unsure if she is having vaginal or rectal discharge.  Patient scheduled for pelvic exam with lauren Zenia Resides, NP tomorrow. 5.   RTC in 6 months for MD assessment, labs (CBC with diff, CMP, CA27.29), and review of mammogram.   Honor Loh, NP  11/29/2018, 11:30 AM   I saw and evaluated the patient, participating in the key portions of the service and reviewing pertinent diagnostic studies and records.  I reviewed the nurse practitioner's note and agree with the findings and the plan.  The assessment and plan were discussed with the patient.  Several questions were asked by the patient and answered.   Nolon Stalls, MD 11/29/2018,11:30 AM

## 2018-11-29 NOTE — Progress Notes (Signed)
Yellow mucus notice with B/M / patient unknown how long

## 2018-11-30 ENCOUNTER — Other Ambulatory Visit: Payer: Self-pay

## 2018-11-30 ENCOUNTER — Inpatient Hospital Stay (HOSPITAL_BASED_OUTPATIENT_CLINIC_OR_DEPARTMENT_OTHER): Payer: Medicare Other | Admitting: Nurse Practitioner

## 2018-11-30 ENCOUNTER — Inpatient Hospital Stay: Payer: Medicare Other

## 2018-11-30 VITALS — BP 153/93 | HR 75 | Temp 96.9°F | Resp 18

## 2018-11-30 DIAGNOSIS — N898 Other specified noninflammatory disorders of vagina: Secondary | ICD-10-CM

## 2018-11-30 DIAGNOSIS — Z79810 Long term (current) use of selective estrogen receptor modulators (SERMs): Secondary | ICD-10-CM

## 2018-11-30 DIAGNOSIS — C50911 Malignant neoplasm of unspecified site of right female breast: Secondary | ICD-10-CM | POA: Diagnosis not present

## 2018-11-30 DIAGNOSIS — C50919 Malignant neoplasm of unspecified site of unspecified female breast: Secondary | ICD-10-CM

## 2018-11-30 DIAGNOSIS — Z170 Estrogen receptor positive status [ER+]: Secondary | ICD-10-CM

## 2018-11-30 LAB — URINALYSIS, COMPLETE (UACMP) WITH MICROSCOPIC
Bilirubin Urine: NEGATIVE
Glucose, UA: NEGATIVE mg/dL
Ketones, ur: 20 mg/dL — AB
Leukocytes,Ua: NEGATIVE
Nitrite: NEGATIVE
Protein, ur: NEGATIVE mg/dL
Specific Gravity, Urine: 1.02 (ref 1.005–1.030)
pH: 5 (ref 5.0–8.0)

## 2018-11-30 LAB — WET PREP, GENITAL
Clue Cells Wet Prep HPF POC: NONE SEEN
Sperm: NONE SEEN
Trich, Wet Prep: NONE SEEN
Yeast Wet Prep HPF POC: NONE SEEN

## 2018-11-30 LAB — CANCER ANTIGEN 27.29: CA 27.29: 28.6 U/mL (ref 0.0–38.6)

## 2018-11-30 MED ORDER — METRONIDAZOLE 500 MG PO TABS: 500.0000 mg | ORAL_TABLET | Freq: Two times a day (BID) | ORAL | 0 refills | Status: DC

## 2018-11-30 NOTE — Patient Instructions (Signed)

## 2018-11-30 NOTE — Progress Notes (Signed)
Symptom Management [facility]  Telephone:([phone] [phone]  Patient Care Team: Kirk Ruths, MD as PCP - General (Internal Medicine)   Name of the patient: Cheryl Diaz  [id number]  [birth year]   Date of visit: 11/30/18  Diagnosis- right breast cancer  Chief complaint/ Reason for visit- Vaginal Discharge  Heme/Onc history:    Breast cancer in female [facility])   10/14/2010 Initial Diagnosis    Right Breast cancer in female [facility]), stage IC T1 cN0 M0. ER PR positive HER-2 negative by [facility]    10/14/2010 Oncotype testing    17     - 02/12/2011 Radiation Therapy       02/12/2011 - 05/15/2011 Anti-estrogen oral therapy    Started Arimidex    05/15/2011 -  Anti-estrogen oral therapy    Discontinued Arimidex and started on tamoxifen due to poor tolerance.     Interval history- Cheryl Diaz, 83 year old female, with above history of stage I right breast cancer currently on tamoxifen, presents to Symptom Management Clinic for complaints of vaginal discharge. She states symptoms started approximately 3 weeks ago and have persisted since that time. She describes discharge as yellow to gray in color. Unsure if she is having dysuria and also reports possibility of rectal discharge. Does not see GI regularly.  She has history of hemorrhoids. Has not had pelvic exam in 'years'. Per record review, saw Dr. Ouida Sills in 2015. Says she would prefer [place] practice if possible.  She denies vaginal bleeding.  Denies history of abnormal Pap smears.  ECOG FS:0 - Asymptomatic  Review of systems- Review of Systems  Constitutional: Negative.   HENT: Negative.   Eyes: Negative.   Respiratory: Negative.   Cardiovascular: Negative.   Gastrointestinal: Positive for constipation (chronic).  Genitourinary: Negative for dysuria, flank pain, frequency, hematuria and urgency.       Nocturia  Musculoskeletal: Negative.   Skin: Negative.   Neurological: Negative.    Psychiatric/Behavioral: The patient is nervous/anxious.        Forgetful     Current treatment- tamoxifen  Allergies  Allergen Reactions  . Diclofenac Other (See Comments)  . Penicillin G Other (See Comments)    lumps and swelling, hives  . Cephalosporins Rash    Past Medical History:  Diagnosis Date  . Anemia   . Arthritis   . Breast cancer ([place]) 10/2010   ER/PR+, Her2neu- RT LUMPECTOMY  . GERD (gastroesophageal reflux disease)   . History of hiatal hernia   . Personal history of radiation therapy   . Radiation 2011   RIGHT BREAST CA    Past Surgical History:  Procedure Laterality Date  . BREAST BIOPSY Right 2012   +  . BREAST LUMPECTOMY Right 2012  . CARPAL TUNNEL RELEASE    . CATARACT EXTRACTION W/PHACO Left 01/12/2018   Procedure: CATARACT EXTRACTION PHACO AND INTRAOCULAR LENS PLACEMENT (IOC) suture placed in left eye at end of proedure no lens inserted anterior vitrectomy;  Surgeon: Eulogio Bear, MD;  Location: [facility] ORS;  Service: Ophthalmology;  Laterality: Left;  [place]  AP% CDE Fluid pack lot # [id number]  . FINGER SURGERY     I&D    Social History   Socioeconomic History  . Marital status: Married    Spouse name: Not on file  . Number of children: Not on file  . Years of education: Not on file  . Highest education level: Not on file  Occupational History  . Not on file  Social Needs  . Financial resource strain: Not on file  . Food insecurity:    Worry: Not on file    Inability: Not on file  . Transportation needs:    Medical: Not on file    Non-medical: Not on file  Tobacco Use  . Smoking status: Former Research scientist (life sciences)  . Smokeless tobacco: Never Used  Substance and Sexual Activity  . Alcohol use: Not Currently  . Drug use: Never  . Sexual activity: Not on file    Comment: quit 48 years ago  Lifestyle  . Physical activity:    Days per week: Not on file    Minutes per session: Not on file  . Stress: Not on file  Relationships  . Social  connections:    Talks on phone: Not on file    Gets together: Not on file    Attends religious service: Not on file    Active member of club or organization: Not on file    Attends meetings of clubs or organizations: Not on file    Relationship status: Not on file  . Intimate partner violence:    Fear of current or ex partner: Not on file    Emotionally abused: Not on file    Physically abused: Not on file    Forced sexual activity: Not on file  Other Topics Concern  . Not on file  Social History Narrative  . Not on file    Family History  Problem Relation Age of Onset  . Breast cancer Neg Hx      Current Outpatient Medications:  .  acetaminophen (TYLENOL) 325 MG tablet, Take 650 mg by mouth every 6 (six) hours as needed., Disp: , Rfl:  .  cyanocobalamin (,VITAMIN B-12,) 1000 MCG/ML injection, Inject 1,000 mcg into the muscle every 30 (thirty) days., Disp: , Rfl:  .  loratadine (CLARITIN) 10 MG tablet, Take 10 mg by mouth daily as needed for allergies., Disp: , Rfl:  .  moxifloxacin (VIGAMOX) 0.5 % ophthalmic solution, 2 (two) times daily. , Disp: , Rfl:  .  naproxen sodium (RA NAPROXEN SODIUM) 220 MG tablet, Take by mouth., Disp: , Rfl:  .  prednisoLONE acetate (PRED FORTE) 1 % ophthalmic suspension, Administer 1 drop into the left eye 6XD., Disp: , Rfl:  .  tamoxifen (NOLVADEX) 20 MG tablet, TAKE 1 TABLET AT BEDTIME. DO NOT TAKE IF YOU ARE PREGNANT. DO NOT SKIP OR STOP A DOSE. TAKE EXACTLY AS PRESCRIBED, Disp: 270 tablet, Rfl: 2  Physical exam:  Vitals:   11/30/18 1103  BP: (!) 153/93  Pulse: 75  Resp: 18  Temp: (!) 96.9 F (36.1 C)  TempSrc: Tympanic   Physical Exam Exam conducted with a chaperone present.  Constitutional:      General: She is not in acute distress.    Comments: accompanied  Cardiovascular:     Rate and Rhythm: Normal rate and regular rhythm.     Pulses: Normal pulses.     Heart sounds: Normal heart sounds.  Pulmonary:     Effort: Pulmonary  effort is normal.     Breath sounds: Normal breath sounds.  Abdominal:     General: Abdomen is flat. There is no distension.     Palpations: Abdomen is soft.     Tenderness: There is no abdominal tenderness. There is no guarding.  Genitourinary:    General: Normal vulva.     Labia:        Right: No rash or lesion.  Left: No rash or lesion.      Vagina: Vaginal discharge (gray thin discharge present) present. No tenderness or lesions. Other prolapse of vaginal walls w/o mention of uterine prolapse: mild cystocele.     Cervix: No discharge, lesion, erythema or cervical bleeding.     Rectum: External hemorrhoid present.     Comments: Bimanual and rectal deferred Skin:    General: Skin is warm and dry.  Neurological:     Mental Status: She is alert and oriented to person, place, and time.  Psychiatric:        Mood and Affect: Mood is anxious.      CMP Latest Ref Rng & Units 11/29/2018  Glucose 70 - 99 mg/dL 191(H)  BUN 8 - 23 mg/dL 15  Creatinine 0.44 - 1.00 mg/dL 0.83  Sodium 135 - 145 mmol/L 141  Potassium 3.5 - 5.1 mmol/L 3.7  Chloride 98 - 111 mmol/L 107  CO2 22 - 32 mmol/L 24  Calcium 8.9 - 10.3 mg/dL 8.8(L)  Total Protein 6.5 - 8.1 g/dL 6.6  Total Bilirubin 0.3 - 1.2 mg/dL 0.8  Alkaline Phos 38 - 126 U/L 50  AST 15 - 41 U/L 24  ALT 0 - 44 U/L 12   CBC Latest Ref Rng & Units 11/29/2018  WBC 4.0 - 10.5 K/uL 5.8  Hemoglobin 12.0 - 15.0 g/dL 13.5  Hematocrit 36.0 - 46.0 % 39.6  Platelets 150 - 400 K/uL 159    No images are attached to the encounter.  No results found.  Assessment and plan- Patient is a 83 y.o. female diagnosed with breast cancer who presents to symptom management clinic for vaginal discharge.  1.  Vaginal discharge-symptoms and exam findings consistent with BV.  Few WBCs on wet prep. Negative for clue cells. Etiology questionable- BV vs vulvovaginal atrophy? Based on clinical symptoms, start metronidazole 500 mg PO bid x 5 days. If persistent,  suspect atrophy related and could do trial of topical estrogen if persistent symptoms vs surveillance. Caution in setting of breast cancer however. Start metronidazole 500 mg PO BID x 5 days.   2. Stage I breast cancer- on tamoxifen. Patient has not been compliant with recommendation for annual pelvic exam. Again discussed increased risk of endometrial/uterine malignancies in setting of tamoxifen and need for annual pelvic exam. Patient verbalizes understanding and agrees to referral today. Will refer to [facility]/Drs [place] to establish care.   3. Hemorrhoids- discuss symptoms and possible referral to GI with pcp.   Follow up with Dr. Mike Gip as scheduled. Please call clinic if symptoms do not improve or worsen. Referral to gyn placed today to establish care in setting of tamoxifen.    Visit Diagnosis 1. Vaginal discharge   2. Malignant neoplasm of breast in female, estrogen receptor positive, unspecified laterality, unspecified site of breast [facility])     Patient expressed understanding and was in agreement with this plan. She also understands that She can call clinic at any time with any questions, concerns, or complaints.   Thank you for allowing me to participate in the care of this very pleasant patient.   Beckey Rutter, DNP, AGNP-C [place] at [facility] [phone] (work cell) [phone] (office)  CC: Dr. Ouida Sills Dr. Mike Gip

## 2018-12-01 DIAGNOSIS — Z79810 Long term (current) use of selective estrogen receptor modulators (SERMs): Secondary | ICD-10-CM | POA: Insufficient documentation

## 2018-12-01 MED ORDER — METRONIDAZOLE 500 MG PO TABS: 500.0000 mg | ORAL_TABLET | Freq: Two times a day (BID) | ORAL | 0 refills | Status: AC

## 2018-12-04 ENCOUNTER — Telehealth: Payer: Self-pay | Admitting: Obstetrics and Gynecology

## 2018-12-04 NOTE — Telephone Encounter (Signed)
[facility] referring for Vaginal discharge, Malignant neoplasm of breast in female, estrogen receptor positive, unspecified laterality, unspecified site of breast ([place]) to [place] location. Voicemail box is full unable to leave message

## 2018-12-05 NOTE — Telephone Encounter (Signed)
Voicemail box is full - unable to leave message

## 2018-12-08 ENCOUNTER — Telehealth: Payer: Self-pay | Admitting: *Deleted

## 2018-12-08 NOTE — Telephone Encounter (Signed)
Daughter called reporting that she has not heard form GYN regarding an appointment. When I read her chart I see that GYN tried to contact patient and voice mail was full. I advised Ms Cheryl Diaz of this and she stated they needed top call her. I asked she she call them herself and gave her the number and she thanked me for calling her back

## 2018-12-11 NOTE — Telephone Encounter (Signed)
Called patient to follow-up. Vaginal discharge has improved. She continues to have some streaking in her underwear and thinks it may be stool. Advised patient that she is scheduled to see Dr. Georgianne Fick at [facility] in [place] on 3/4 at 10:30 to establish care in setting of tamoxifen. Again discussed rationale. Patient verbalized understanding and agrees to appointment.

## 2018-12-11 NOTE — Telephone Encounter (Signed)
  Lauren and Hassan Rowan,  Thank you for getting her set up with  OB/GYN.  M

## 2018-12-13 ENCOUNTER — Encounter: Payer: Self-pay | Admitting: Obstetrics and Gynecology

## 2018-12-13 ENCOUNTER — Ambulatory Visit (INDEPENDENT_AMBULATORY_CARE_PROVIDER_SITE_OTHER): Payer: Medicare Other | Admitting: Obstetrics and Gynecology

## 2018-12-13 VITALS — BP 146/79 | HR 68 | Ht 62.0 in | Wt 147.0 lb

## 2018-12-13 DIAGNOSIS — N8111 Cystocele, midline: Secondary | ICD-10-CM

## 2018-12-13 DIAGNOSIS — N898 Other specified noninflammatory disorders of vagina: Secondary | ICD-10-CM

## 2018-12-13 DIAGNOSIS — N952 Postmenopausal atrophic vaginitis: Secondary | ICD-10-CM

## 2018-12-13 DIAGNOSIS — Z79810 Long term (current) use of selective estrogen receptor modulators (SERMs): Secondary | ICD-10-CM

## 2018-12-13 DIAGNOSIS — N760 Acute vaginitis: Secondary | ICD-10-CM

## 2018-12-13 NOTE — Progress Notes (Signed)
Obstetrics & Gynecology Office Visit   Chief Complaint:  Chief Complaint  Patient presents with  . vaginal irritation    vaginal discharge w/slight color, no odor    History of Present Illness: 83 year old with  female with history of ER/PR+ HER2neu negative right breast cancer s/p lumpectomy and radiation seen in consultation at the requent of Beckey Rutter, NP and Nolon Stalls at [facility].  The patient reports and approximately 2 week history of yellow discharge that she has noted on her underwear.  Has used small unscented liner because of this.  She has not noted any vaginal bleeding, brown or rust color to the discharge.  She denies vaginal itching or odor.  States bladder has not given her any issues as far as urge or stress incontinence.  She is not sexually active.  No abdominal or pelvic pain coinciding with onset of discharge.  She does report she has noted some yellowish discharge in her stools    Review of Systems: Review of Systems  Constitutional: Negative.   Gastrointestinal: Negative for abdominal pain, constipation and diarrhea.  Genitourinary: Positive for frequency. Negative for dysuria, flank pain, hematuria and urgency.  Skin: Negative.      Past Medical History:  Past Medical History:  Diagnosis Date  . Anemia   . Arthritis   . Breast cancer ([place]) 10/2010   ER/PR+, Her2neu- RT LUMPECTOMY  . GERD (gastroesophageal reflux disease)   . History of hiatal hernia   . Personal history of radiation therapy   . Radiation 2011   RIGHT BREAST CA    Past Surgical History:  Past Surgical History:  Procedure Laterality Date  . BREAST BIOPSY Right 2012   +  . BREAST LUMPECTOMY Right 2012  . CARPAL TUNNEL RELEASE    . CATARACT EXTRACTION W/PHACO Left 01/12/2018   Procedure: CATARACT EXTRACTION PHACO AND INTRAOCULAR LENS PLACEMENT (IOC) suture placed in left eye at end of proedure no lens inserted anterior vitrectomy;  Surgeon: Eulogio Bear, MD;   Location: [facility] ORS;  Service: Ophthalmology;  Laterality: Left;  [place]  AP% CDE Fluid pack lot # [id number]  . FINGER SURGERY     I&D    Gynecologic History: No LMP recorded. Patient is postmenopausal.  Obstetric History: No obstetric history on file.  Family History:  Family History  Problem Relation Age of Onset  . Breast cancer Neg Hx     Social History:  Social History   Socioeconomic History  . Marital status: Married    Spouse name: Not on file  . Number of children: Not on file  . Years of education: Not on file  . Highest education level: Not on file  Occupational History  . Not on file  Social Needs  . Financial resource strain: Not on file  . Food insecurity:    Worry: Not on file    Inability: Not on file  . Transportation needs:    Medical: Not on file    Non-medical: Not on file  Tobacco Use  . Smoking status: Former Research scientist (life sciences)  . Smokeless tobacco: Never Used  Substance and Sexual Activity  . Alcohol use: Not Currently  . Drug use: Never  . Sexual activity: Not Currently    Birth control/protection: None    Comment: quit 48 years ago  Lifestyle  . Physical activity:    Days per week: Not on file    Minutes per session: Not on file  . Stress: Not  on file  Relationships  . Social connections:    Talks on phone: Not on file    Gets together: Not on file    Attends religious service: Not on file    Active member of club or organization: Not on file    Attends meetings of clubs or organizations: Not on file    Relationship status: Not on file  . Intimate partner violence:    Fear of current or ex partner: Not on file    Emotionally abused: Not on file    Physically abused: Not on file    Forced sexual activity: Not on file  Other Topics Concern  . Not on file  Social History Narrative  . Not on file    Allergies:  Allergies  Allergen Reactions  . Diclofenac Other (See Comments)  . Penicillin G Other (See Comments)    lumps and swelling, hives   . Cephalosporins Rash    Medications: Prior to Admission medications   Medication Sig Start Date End Date Taking? Authorizing Provider  acetaminophen (TYLENOL) 325 MG tablet Take 650 mg by mouth every 6 (six) hours as needed.    [provider]  cyanocobalamin (,VITAMIN B-12,) 1000 MCG/ML injection Inject 1,000 mcg into the muscle every 30 (thirty) days.    [provider]  loratadine (CLARITIN) 10 MG tablet Take 10 mg by mouth daily as needed for allergies.    [provider]  moxifloxacin (VIGAMOX) 0.5 % ophthalmic solution 2 (two) times daily.     [provider]  naproxen sodium (RA NAPROXEN SODIUM) 220 MG tablet Take by mouth.    [provider]  prednisoLONE acetate (PRED FORTE) 1 % ophthalmic suspension Administer 1 drop into the left eye 6XD.    [provider]  tamoxifen (NOLVADEX) 20 MG tablet TAKE 1 TABLET AT BEDTIME. DO NOT TAKE IF YOU ARE PREGNANT. DO NOT SKIP OR STOP A DOSE. TAKE EXACTLY AS PRESCRIBED 01/30/18   Lequita Asal, MD    Physical Exam Vitals: Blood pressure (!) 146/79, pulse 68, height 5\' 2"  (1.575 m), weight 147 lb (66.7 kg).  No LMP recorded. Patient is postmenopausal.  General: NAD HEENT: normocephalic, anicteric Pulmonary: No increased work of breathing Genitourinary:  External: Normal external female genitalia with appropriate age related changes.  Urethral prolapse is noted, normal Bartholin's gland, small calcification noted at site of Skene's glands.    Vagina: Loss of rugae, slightly, small anterior cystocele  Cervix: Grossly normal in appearance, no bleeding  Uterus: Non-enlarged, mobile, normal contour.  No CMT  Adnexa: ovaries non-enlarged, no adnexal masses  Rectal: deferred  Lymphatic: no evidence of inguinal lymphadenopathy Extremities: no edema, erythema, or tenderness Neurologic: Grossly intact Psychiatric: mood appropriate, affect full  Female chaperone present for pelvic   portions of the physical exam  Wet Prep: PH: 4.5 Clue Cells: Negative Fungal elements: Negative Trichomonas: Negative   Assessment: 83 y.o. on tamoxifen secondary to personal history of breast cancer with vaginal discharge  Plan: Problem List Items Addressed This Visit      Other   Long-term current use of tamoxifen   Relevant Orders   US PELVIC COMPLETE WITH TRANSVAGINAL    Other Visit Diagnoses    Acute vaginitis    -  Primary   Relevant Orders   NuSwab BV and Candida, NAA   US PELVIC COMPLETE WITH TRANSVAGINAL   Vaginal discharge       Relevant Orders   US PELVIC COMPLETE WITH TRANSVAGINAL  Vaginal atrophy       Cystocele, midline         1) Premenopausal women treated with tamoxifen have no known increased risk of uterine cancer and require no additional monitoring beyond routine gynecologic care ("Tamoxifen and Uterine Cancer" ACOG Committee Opinion Number 030 June 2014 and reaffirmed 2019) - no clear data that I could find as to if this is also applicable for postmenopausal tamoxifen use.  We discussed that in the past there was concern for endometrial hyperplasia with tamoxifen use - no blood or brown noted in discharge but will obtained TVUS to evalute the uterine lining.  Necrotizing polyp of submucosal fibroid may also present with discharge  2) Vaginal discharge - Nuswab sent to rule out BV or candida.  In office wet prep was negative with normal vaginal pH,  She does have some urethral prolapse.  Generally this is responsive to topical estrogen but not sure if I would recommend this in the setting of ER positive breast cancer despite minimal systemic absorption.  We also discussed that all the treatments for vaginal atrophy and urethral prolapse have one main side-effect - vaginal discharge. We also discussed the possibility of discharge being GI in origin and possibly being steatorrhea.   3) A total of 30 minutes were spent in face-to-face contact with the patient  during this encounter with over half of that time devoted to counseling and coordination of care.  4) Will contact patient and her oncology team with results of ultrasound and long term recommendations   Malachy Mood, MD, [place], [place] 12/13/2018, 11:34 AM

## 2018-12-15 LAB — NUSWAB BV AND CANDIDA, NAA
Candida albicans, NAA: NEGATIVE
Candida glabrata, NAA: NEGATIVE

## 2018-12-22 ENCOUNTER — Other Ambulatory Visit: Payer: Self-pay

## 2018-12-22 ENCOUNTER — Ambulatory Visit
Admission: RE | Admit: 2018-12-22 | Discharge: 2018-12-22 | Disposition: A | Discharge status: Home / Self Care | Payer: Medicare Other | Source: Ambulatory Visit | Attending: Obstetrics and Gynecology | Admitting: Obstetrics and Gynecology

## 2018-12-22 ENCOUNTER — Ambulatory Visit
Admission: RE | Admit: 2018-12-22 | Payer: Medicare Other | Source: Ambulatory Visit | Admitting: Obstetrics and Gynecology

## 2018-12-22 DIAGNOSIS — Z79810 Long term (current) use of selective estrogen receptor modulators (SERMs): Secondary | ICD-10-CM

## 2018-12-22 DIAGNOSIS — N760 Acute vaginitis: Secondary | ICD-10-CM | POA: Diagnosis present

## 2018-12-22 DIAGNOSIS — N898 Other specified noninflammatory disorders of vagina: Secondary | ICD-10-CM | POA: Diagnosis present

## 2018-12-27 ENCOUNTER — Ambulatory Visit: Payer: Medicare Other | Admitting: Obstetrics and Gynecology

## 2018-12-27 ENCOUNTER — Other Ambulatory Visit: Payer: Self-pay | Admitting: Obstetrics and Gynecology

## 2018-12-27 NOTE — Progress Notes (Signed)
Spoke with patient, structurally normal uterus and endometrial stripe 47mm.  No bleeding just discharge. We discussed based on ultrasound no further work up indicated.

## 2019-04-05 ENCOUNTER — Inpatient Hospital Stay: Admission: RE | Admit: 2019-04-05 | Payer: Medicare Other | Source: Ambulatory Visit

## 2019-04-25 ENCOUNTER — Other Ambulatory Visit: Payer: Self-pay | Admitting: Hematology and Oncology

## 2019-04-25 ENCOUNTER — Telehealth: Payer: Self-pay

## 2019-04-25 MED ORDER — TAMOXIFEN CITRATE 20 MG PO TABS: ORAL_TABLET | ORAL | 3 refills | Status: AC

## 2019-04-25 NOTE — Telephone Encounter (Signed)
Informed patient of Tamoxifen script that was received for 270 pills x 3 refills. Informed patient Dr. Mike Gip will be sending in 90 pills x 3 refills. Patient confirms she is only taking 1 pill a day.

## 2019-04-25 NOTE — Telephone Encounter (Signed)
Contacted [facility] to cancel out Tamoxifen refill ,per Dr. Kem Parkinson request, that consisted of 270 tablets x 3 refills. Correct RX was sent for 90 tablets x 3 refills.

## 2019-04-25 NOTE — Telephone Encounter (Signed)
-----   Message from Lequita Asal, MD sent at 04/25/2019  6:00 AM EDT ----- Regarding: Please call patient  Rx for tamoxifen requested for 270 pills with 3 refills.  She should be taking 1 pill a day.  She can have a 3 month supply (90 pills) with 3 refills.  M

## 2019-05-30 ENCOUNTER — Inpatient Hospital Stay: Payer: Medicare Other

## 2019-05-30 ENCOUNTER — Inpatient Hospital Stay: Payer: Medicare Other | Attending: Hematology and Oncology | Admitting: Oncology

## 2023-09-03 ENCOUNTER — Other Ambulatory Visit: Payer: Self-pay

## 2023-09-03 ENCOUNTER — Emergency Department: Payer: Medicare Other

## 2023-09-03 ENCOUNTER — Emergency Department
Admission: EM | Admit: 2023-09-03 | Discharge: 2023-09-03 | Disposition: A | Discharge status: Home / Self Care | Payer: Medicare Other | Attending: Emergency Medicine | Admitting: Emergency Medicine

## 2023-09-03 DIAGNOSIS — K644 Residual hemorrhoidal skin tags: Secondary | ICD-10-CM | POA: Diagnosis not present

## 2023-09-03 DIAGNOSIS — K5900 Constipation, unspecified: Secondary | ICD-10-CM | POA: Diagnosis not present

## 2023-09-03 DIAGNOSIS — K6289 Other specified diseases of anus and rectum: Secondary | ICD-10-CM | POA: Diagnosis present

## 2023-09-03 DIAGNOSIS — I10 Essential (primary) hypertension: Secondary | ICD-10-CM | POA: Insufficient documentation

## 2023-09-03 DIAGNOSIS — F0390 Unspecified dementia without behavioral disturbance: Secondary | ICD-10-CM | POA: Insufficient documentation

## 2023-09-03 DIAGNOSIS — E876 Hypokalemia: Secondary | ICD-10-CM | POA: Diagnosis not present

## 2023-09-03 LAB — CBC WITH DIFFERENTIAL/PLATELET
Abs Immature Granulocytes: 0.03 10*3/uL (ref 0.00–0.07)
Basophils Absolute: 0.1 10*3/uL (ref 0.0–0.1)
Basophils Relative: 1 %
Eosinophils Absolute: 0.1 10*3/uL (ref 0.0–0.5)
Eosinophils Relative: 1 %
HCT: 41.1 % (ref 36.0–46.0)
Hemoglobin: 14.2 g/dL (ref 12.0–15.0)
Immature Granulocytes: 0 %
Lymphocytes Relative: 17 %
Lymphs Abs: 1.5 10*3/uL (ref 0.7–4.0)
MCH: 32.1 pg (ref 26.0–34.0)
MCHC: 34.5 g/dL (ref 30.0–36.0)
MCV: 93 fL (ref 80.0–100.0)
Monocytes Absolute: 0.9 10*3/uL (ref 0.1–1.0)
Monocytes Relative: 11 %
Neutro Abs: 5.8 10*3/uL (ref 1.7–7.7)
Neutrophils Relative %: 70 %
Platelets: 222 10*3/uL (ref 150–400)
RBC: 4.42 MIL/uL (ref 3.87–5.11)
RDW: 13.1 % (ref 11.5–15.5)
WBC: 8.4 10*3/uL (ref 4.0–10.5)
nRBC: 0 % (ref 0.0–0.2)

## 2023-09-03 LAB — COMPREHENSIVE METABOLIC PANEL
ALT: 15 U/L (ref 0–44)
AST: 21 U/L (ref 15–41)
Albumin: 4.1 g/dL (ref 3.5–5.0)
Alkaline Phosphatase: 49 U/L (ref 38–126)
Anion gap: 13 (ref 5–15)
BUN: 20 mg/dL (ref 8–23)
CO2: 26 mmol/L (ref 22–32)
Calcium: 9.2 mg/dL (ref 8.9–10.3)
Chloride: 98 mmol/L (ref 98–111)
Creatinine, Ser: 0.67 mg/dL (ref 0.44–1.00)
GFR, Estimated: >60 mL/min (ref >60)
Glucose, Bld: 143 mg/dL — ABNORMAL HIGH (ref 70–99)
Potassium: 3.1 mmol/L — ABNORMAL LOW (ref 3.5–5.1)
Sodium: 137 mmol/L (ref 135–145)
Total Bilirubin: 1.3 mg/dL — ABNORMAL HIGH (ref <1.2)
Total Protein: 7.5 g/dL (ref 6.5–8.1)

## 2023-09-03 LAB — URINALYSIS, ROUTINE W REFLEX MICROSCOPIC
Bilirubin Urine: NEGATIVE
Glucose, UA: NEGATIVE mg/dL
Hgb urine dipstick: NEGATIVE
Ketones, ur: NEGATIVE mg/dL
Leukocytes,Ua: NEGATIVE
Nitrite: NEGATIVE
Protein, ur: NEGATIVE mg/dL
Specific Gravity, Urine: 1.012 (ref 1.005–1.030)
pH: 5 (ref 5.0–8.0)

## 2023-09-03 LAB — LIPASE, BLOOD: Lipase: 33 U/L (ref 11–51)

## 2023-09-03 MED ORDER — MINERAL OIL RE ENEM
1.0000 | ENEMA | Freq: Once | RECTAL | Status: AC
  Administered 2023-09-03: 1 via RECTAL

## 2023-09-03 MED ORDER — IOHEXOL 350 MG/ML SOLN
75.0000 mL | Freq: Once | INTRAVENOUS | Status: AC | PRN
  Administered 2023-09-03: 75 mL via INTRAVENOUS

## 2023-09-03 MED ORDER — POTASSIUM CHLORIDE CRYS ER 20 MEQ PO TBCR: 20.0000 meq | EXTENDED_RELEASE_TABLET | Freq: Every day | ORAL | 0 refills | Status: AC

## 2023-09-03 NOTE — Discharge Instructions (Addendum)
I think this was secondary to constipation and probably some pain from a small hemorrhoid.  Take the senna, docusate and you can even take MiraLAX daily to try to help soften the stools.  You can use tucks pads to help with the hemorrhoid pain.  Return to ER for worsening symptoms or any other concerns.   I prescribed some potassium pills for the next couple days to help with her potassium just while she continues to have more stooling.  She has some gallstones noted in her gallbladder.  Do not believe these are agitating her today but if she ever develops upper abdominal pain, fevers she should return to the ER for repeat evaluation   IMPRESSION: 1. No acute findings in the abdomen or pelvis. No findings to explain the patient's symptoms. 2. Cholelithiasis. 3. Moderate hiatal hernia.

## 2023-09-03 NOTE — ED Provider Notes (Signed)
[facility] Provider Note    Event Date/Time   First MD Initiated Contact with Patient 09/03/23 1122     (approximate)   History   Constipation   HPI  Cheryl Diaz is a 87 y.o. female with history of dementia who comes in with concerns for rectal pain.  I reviewed patient's office visit from 08/24/2023 where patient was started on lactulose 15 mL twice daily for constipation.  It was thought to be secondary to patient's cognitive dysfunction and difficulty pushing out the stool.  Patient is a history of hypertension, hyperlipidemia, dementia, recurrent constipation.  Patient lives at home with her husband.  According to the daughter who is at bedside she has had issues with constipation for the past few weeks.  She has had no change in the amount she is eating and no nausea or vomiting.  They report that she just has pain when she tries to have a bowel movement in her rectal area.  They deny any other concerns.  Physical Exam   Triage Vital Signs: ED Triage Vitals  Encounter Vitals Group     BP 09/03/23 1057 132/85     Systolic BP Percentile --      Diastolic BP Percentile --      Pulse Rate 09/03/23 1057 85     Resp 09/03/23 1057 16     Temp 09/03/23 1057 97.9 F (36.6 C)     Temp Source 09/03/23 1057 Oral     SpO2 09/03/23 1057 99 %     Weight 09/03/23 1058 148 lb (67.1 kg)     Height 09/03/23 1058 5\' 2"  (1.575 m)     Head Circumference --      Peak Flow --      Pain Score 09/03/23 1058 10     Pain Loc --      Pain Education --      Exclude from Growth Chart --     Most recent vital signs: Vitals:   09/03/23 1057  BP: 132/85  Pulse: 85  Resp: 16  Temp: 97.9 F (36.6 C)  SpO2: 99%     General: Awake, no distress.  CV:  Good peripheral perfusion.  Resp:  Normal effort.  Abd:  Soft and nontender abdominal exam without any rebound or guarding. Other:  Rectal exam with small hemorrhoid externally noted.  Tenderness with rectal  exam with large amount of stool noted high up in the rectal vault.   ED Results / Procedures / Treatments   Labs (all labs ordered are listed, but only abnormal results are displayed) Labs Reviewed  COMPREHENSIVE METABOLIC PANEL - Abnormal; Notable for the following components:      Result Value   Potassium 3.1 (*)    Glucose, Bld 143 (*)    Total Bilirubin 1.3 (*)    All other components within normal limits  URINALYSIS, ROUTINE W REFLEX MICROSCOPIC - Abnormal; Notable for the following components:   Color, Urine YELLOW (*)    APPearance HAZY (*)    All other components within normal limits  CBC WITH DIFFERENTIAL/PLATELET  LIPASE, BLOOD      PROCEDURES:  ------------------------------------------------------------------------------------------------------------------- Fecal Disimpaction Procedure Note:  Performed by me:  Patient placed in the lateral recumbent position with knees drawn towards chest. Nurse present for patient support. Stool ball felt high up and only able to remove Small amount of  brown. Pt requesting to stop.  No complications during procedure.   ------------------------------------------------------------------------------------------------------------------   Critical  Care performed: No  Procedures   MEDICATIONS ORDERED IN ED: Medications  mineral oil enema 1 enema (1 enema Rectal Given 09/03/23 1319)  iohexol (OMNIPAQUE) 350 MG/ML injection 75 mL (75 mLs Intravenous Contrast Given 09/03/23 1517)     IMPRESSION / MDM / ASSESSMENT AND PLAN / ED COURSE  I reviewed the triage vital signs and the nursing notes.   Patient's presentation is most consistent with acute presentation with potential threat to life or bodily function.   I reviewed her blood work from 06/09/2023 where she has had normal CMP.  She has had no changes in her eating habits to suggest AKI or dehydration.  Her abdomen is soft and nontender I have low suspicion for acute  perforation appendicitis or diverticulitis.  She only has tenderness with rectal exam and she has an external hemorrhoid that is noted with stool noted in the vault therefore this is very concerning for constipation and stool ball.  Fortunately the stool ball is pretty high up and unable to remove it and patient requested me to stop.  The stool was brown in nature.  Patient stated that she felt like she was going to have a bowel movement so patient attempted to use the toilet.   Fortunately patient did not have any pass of stool.  Therefore discussed with the family members getting blood work, CT scan if still unable to have a bowel movement to see if there is anything else that could be going on.  Her urine was negative for UTI CBC is reassuring.  Her CMP shows slightly low potassium we will put her on some oral pills for the next few days.  Lipase is normal  IMPRESSION: 1. No acute findings in the abdomen or pelvis. No findings to explain the patient's symptoms. 2. Cholelithiasis. 3. Moderate hiatal hernia.  CT findings were provided to the family.  She has got no right upper quadrant tenderness to suggest cholecystitis.  Her issue is more with having a bowel movement and rectal pain.  Patient did have a bowel movement with a large brown stool ball that came out.  We discussed constipation treatment at home with senna, docusate, MiraLAX and return to the ER with worsening symptoms.  The patient is on the cardiac monitor to evaluate for evidence of arrhythmia and/or significant heart rate changes.      FINAL CLINICAL IMPRESSION(S) / ED DIAGNOSES   Final diagnoses:  Constipation, unspecified constipation type  External hemorrhoid  Hypokalemia     Rx / DC Orders   ED Discharge Orders          Ordered    potassium chloride SA (KLOR-CON M) 20 MEQ tablet  Daily        09/03/23 1657             Note:  This document was prepared using Dragon voice recognition software and may  include unintentional dictation errors.   Concha Se, MD 09/03/23 [phone]

## 2023-09-03 NOTE — ED Notes (Signed)
ED Provider at bedside. 

## 2023-09-03 NOTE — ED Triage Notes (Signed)
Per daughter, rectal pain since last night. States sitter said pt was up all night d/t pain. Pt had BM yesterday, but per daughter it was a very hard stool. Pt has been constipated off and on for several months; seen PCP on 11/13 and was started on lactulose 1tbsp twice daily.
